# Patient Record
Sex: Female | Born: 1950 | Race: Black or African American | Hispanic: No | Smoking: Never smoker
Health system: Southern US, Community
[De-identification: ages and names within clinical notes are randomized; demographics above are authoritative.]

## PROBLEM LIST (undated history)

## (undated) DIAGNOSIS — E782 Mixed hyperlipidemia: Secondary | ICD-10-CM

## (undated) DIAGNOSIS — I10 Essential (primary) hypertension: Secondary | ICD-10-CM

## (undated) DIAGNOSIS — E119 Type 2 diabetes mellitus without complications: Secondary | ICD-10-CM

## (undated) DIAGNOSIS — M1990 Unspecified osteoarthritis, unspecified site: Secondary | ICD-10-CM

## (undated) DIAGNOSIS — M5430 Sciatica, unspecified side: Secondary | ICD-10-CM

## (undated) HISTORY — PX: OTHER SURGICAL HISTORY: SHX169

## (undated) HISTORY — DX: Type 2 diabetes mellitus without complications: E11.9

## (undated) HISTORY — PX: TUBAL LIGATION: SHX77

## (undated) HISTORY — DX: Unspecified osteoarthritis, unspecified site: M19.90

## (undated) HISTORY — DX: Mixed hyperlipidemia: E78.2

## (undated) HISTORY — PX: ABDOMINAL HYSTERECTOMY: SHX81

## (undated) HISTORY — DX: Sciatica, unspecified side: M54.30

## (undated) HISTORY — DX: Essential (primary) hypertension: I10

---

## 2011-03-05 ENCOUNTER — Ambulatory Visit: Payer: Self-pay | Admitting: Gastroenterology

## 2019-04-20 ENCOUNTER — Other Ambulatory Visit (HOSPITAL_COMMUNITY): Payer: Self-pay | Admitting: Family

## 2019-04-20 DIAGNOSIS — R928 Other abnormal and inconclusive findings on diagnostic imaging of breast: Secondary | ICD-10-CM

## 2019-04-28 ENCOUNTER — Other Ambulatory Visit (HOSPITAL_COMMUNITY): Payer: Self-pay | Admitting: Family

## 2019-04-28 DIAGNOSIS — R928 Other abnormal and inconclusive findings on diagnostic imaging of breast: Secondary | ICD-10-CM

## 2019-05-05 ENCOUNTER — Other Ambulatory Visit (HOSPITAL_COMMUNITY): Payer: Self-pay | Admitting: Family

## 2019-05-11 ENCOUNTER — Other Ambulatory Visit: Payer: Self-pay

## 2019-05-11 ENCOUNTER — Ambulatory Visit (HOSPITAL_COMMUNITY)
Admission: RE | Admit: 2019-05-11 | Discharge: 2019-05-11 | Disposition: A | Discharge status: Home / Self Care | Payer: Medicare HMO | Source: Ambulatory Visit | Attending: Family | Admitting: Family

## 2019-05-11 ENCOUNTER — Ambulatory Visit (HOSPITAL_COMMUNITY): Payer: Medicare HMO

## 2019-05-11 DIAGNOSIS — R928 Other abnormal and inconclusive findings on diagnostic imaging of breast: Secondary | ICD-10-CM | POA: Diagnosis present

## 2020-04-17 ENCOUNTER — Other Ambulatory Visit (HOSPITAL_COMMUNITY): Payer: Self-pay | Admitting: Family

## 2020-04-17 DIAGNOSIS — Z1231 Encounter for screening mammogram for malignant neoplasm of breast: Secondary | ICD-10-CM

## 2020-05-12 ENCOUNTER — Other Ambulatory Visit: Payer: Self-pay

## 2020-05-12 ENCOUNTER — Ambulatory Visit (HOSPITAL_COMMUNITY)
Admission: RE | Admit: 2020-05-12 | Discharge: 2020-05-12 | Disposition: A | Discharge status: Home / Self Care | Payer: Medicare HMO | Source: Ambulatory Visit | Attending: Family | Admitting: Family

## 2020-05-12 DIAGNOSIS — Z1231 Encounter for screening mammogram for malignant neoplasm of breast: Secondary | ICD-10-CM | POA: Insufficient documentation

## 2020-08-07 ENCOUNTER — Inpatient Hospital Stay (HOSPITAL_COMMUNITY): Payer: Medicare HMO

## 2020-08-07 ENCOUNTER — Inpatient Hospital Stay (HOSPITAL_COMMUNITY): Payer: Medicare HMO | Attending: Oncology | Admitting: Hematology

## 2020-08-07 ENCOUNTER — Other Ambulatory Visit: Payer: Self-pay

## 2020-08-07 ENCOUNTER — Encounter (HOSPITAL_COMMUNITY): Payer: Self-pay | Admitting: Hematology

## 2020-08-07 VITALS — BP 113/67 | HR 112 | Temp 97.6°F | Resp 18 | Ht 62.5 in | Wt 218.0 lb

## 2020-08-07 DIAGNOSIS — Z6836 Body mass index (BMI) 36.0-36.9, adult: Secondary | ICD-10-CM | POA: Diagnosis not present

## 2020-08-07 DIAGNOSIS — I10 Essential (primary) hypertension: Secondary | ICD-10-CM | POA: Insufficient documentation

## 2020-08-07 DIAGNOSIS — E785 Hyperlipidemia, unspecified: Secondary | ICD-10-CM | POA: Diagnosis not present

## 2020-08-07 DIAGNOSIS — E119 Type 2 diabetes mellitus without complications: Secondary | ICD-10-CM | POA: Insufficient documentation

## 2020-08-07 DIAGNOSIS — D72829 Elevated white blood cell count, unspecified: Secondary | ICD-10-CM

## 2020-08-07 DIAGNOSIS — Z79899 Other long term (current) drug therapy: Secondary | ICD-10-CM | POA: Diagnosis not present

## 2020-08-07 DIAGNOSIS — D72820 Lymphocytosis (symptomatic): Secondary | ICD-10-CM

## 2020-08-07 DIAGNOSIS — D75839 Thrombocytosis, unspecified: Secondary | ICD-10-CM

## 2020-08-07 DIAGNOSIS — Z806 Family history of leukemia: Secondary | ICD-10-CM | POA: Diagnosis not present

## 2020-08-07 DIAGNOSIS — E669 Obesity, unspecified: Secondary | ICD-10-CM | POA: Diagnosis not present

## 2020-08-07 DIAGNOSIS — Z596 Low income: Secondary | ICD-10-CM | POA: Diagnosis not present

## 2020-08-07 LAB — CBC WITH DIFFERENTIAL/PLATELET
Abs Immature Granulocytes: 0.04 10*3/uL (ref 0.00–0.07)
Basophils Absolute: 0 10*3/uL (ref 0.0–0.1)
Basophils Relative: 0 %
Eosinophils Absolute: 0.1 10*3/uL (ref 0.0–0.5)
Eosinophils Relative: 0 %
HCT: 42.9 % (ref 36.0–46.0)
Hemoglobin: 14 g/dL (ref 12.0–15.0)
Immature Granulocytes: 0 %
Lymphocytes Relative: 37 %
Lymphs Abs: 5 10*3/uL — ABNORMAL HIGH (ref 0.7–4.0)
MCH: 30 pg (ref 26.0–34.0)
MCHC: 32.6 g/dL (ref 30.0–36.0)
MCV: 92.1 fL (ref 80.0–100.0)
Monocytes Absolute: 1.1 10*3/uL — ABNORMAL HIGH (ref 0.1–1.0)
Monocytes Relative: 8 %
Neutro Abs: 7.3 10*3/uL (ref 1.7–7.7)
Neutrophils Relative %: 55 %
Platelets: 396 10*3/uL (ref 150–400)
RBC: 4.66 MIL/uL (ref 3.87–5.11)
RDW: 13.8 % (ref 11.5–15.5)
WBC: 13.6 10*3/uL — ABNORMAL HIGH (ref 4.0–10.5)
nRBC: 0 % (ref 0.0–0.2)

## 2020-08-07 LAB — COMPREHENSIVE METABOLIC PANEL
ALT: 15 U/L (ref 0–44)
AST: 17 U/L (ref 15–41)
Albumin: 4.2 g/dL (ref 3.5–5.0)
Alkaline Phosphatase: 97 U/L (ref 38–126)
Anion gap: 10 (ref 5–15)
BUN: 16 mg/dL (ref 8–23)
CO2: 25 mmol/L (ref 22–32)
Calcium: 8.9 mg/dL (ref 8.9–10.3)
Chloride: 101 mmol/L (ref 98–111)
Creatinine, Ser: 0.71 mg/dL (ref 0.44–1.00)
GFR, Estimated: >60 mL/min (ref >60)
Glucose, Bld: 95 mg/dL (ref 70–99)
Potassium: 3.3 mmol/L — ABNORMAL LOW (ref 3.5–5.1)
Sodium: 136 mmol/L (ref 135–145)
Total Bilirubin: 0.6 mg/dL (ref 0.3–1.2)
Total Protein: 8 g/dL (ref 6.5–8.1)

## 2020-08-07 LAB — LACTATE DEHYDROGENASE: LDH: 140 U/L (ref 98–192)

## 2020-08-07 LAB — SEDIMENTATION RATE: Sed Rate: 35 mm/hr — ABNORMAL HIGH (ref 0–22)

## 2020-08-07 LAB — C-REACTIVE PROTEIN: CRP: 0.9 mg/dL (ref <1.0)

## 2020-08-07 NOTE — Progress Notes (Signed)
CONSULT NOTE  Patient Care Team: [facility] as PCP - General  CHIEF COMPLAINTS/PURPOSE OF CONSULTATION: Leukocytosis and thrombocytosis  HISTORY OF PRESENTING ILLNESS:  Deborah Todd 69 y.o. female is here because of leukocytosis with mainly lymphocytes and monocytes and thrombocytosis.  She was referred by her primary care provider from [facility] family medicine.   On chart review her white count has been as elevated as 13.5 (lymphocytes 4928 and monocytes 1013) and platelets have been as high as 455,000 dated back from March 2020.  Deborah Todd is a 69 year old female who is relatively healthy.  She has past medical history significant for hypertension, type 2 diabetes, obesity and hyperlipidemia.  She denies any recent infections, acute or chronic inflammation, history of autoimmune disease, suspicious medications such as systemic steroid use, previous abdominal surgeries or surgical splenectomy.  She is not a current smoker.  She is obese her BMI is 36.9.  Denies family history of blood disorders including CML or other myeloma proliferative neoplasms.  Denies a history of anemia. She has been treated on several occasions for sciatic pain with steroid tapers but none recently.  She denies any B symptoms including anorexia, night sweats or change in appetite.  She denies any abdominal bloating or pain.  She denies any allergies.  She believes her mother passed away from leukemia at the age of 103.  MEDICAL HISTORY:  No past medical history on file.  SURGICAL HISTORY: Non-smoker  SOCIAL HISTORY: Social History   Socioeconomic History  . Marital status: Divorced    Spouse name: Not on file  . Number of children: Not on file  . Years of education: Not on file  . Highest education level: Not on file  Occupational History  . Not on file  Tobacco Use  . Smoking status: Never Smoker  . Smokeless tobacco: Never Used  Substance and Sexual Activity  .  Alcohol use: Never  . Drug use: Never  . Sexual activity: Not Currently  Other Topics Concern  . Not on file  Social History Narrative  . Not on file   Social Determinants of Health   Financial Resource Strain: Medium Risk  . Difficulty of Paying Living Expenses: Somewhat hard  Food Insecurity: No Food Insecurity  . Worried About Charity fundraiser in the Last Year: Never true  . Ran Out of Food in the Last Year: Never true  Transportation Needs: No Transportation Needs  . Lack of Transportation (Medical): No  . Lack of Transportation (Non-Medical): No  Physical Activity: Inactive  . Days of Exercise per Week: 0 days  . Minutes of Exercise per Session: 0 min  Stress: No Stress Concern Present  . Feeling of Stress : Not at all  Social Connections: Moderately Isolated  . Frequency of Communication with Friends and Family: More than three times a week  . Frequency of Social Gatherings with Friends and Family: Once a week  . Attends Religious Services: More than 4 times per year  . Active Member of Clubs or Organizations: No  . Attends Archivist Meetings: Never  . Marital Status: Divorced  Human resources officer Violence: Not At Risk  . Fear of Current or Ex-Partner: No  . Emotionally Abused: No  . Physically Abused: No  . Sexually Abused: No    FAMILY HISTORY: No family history on file.  ALLERGIES:  has No Known Allergies.  MEDICATIONS:  Current Outpatient Medications  Medication Sig Dispense Refill  . Ascorbic  Acid (VITAMIN C PO) Take by mouth.    Marland Kitchen atorvastatin (LIPITOR) 20 MG tablet Take 20 mg by mouth daily.    Marland Kitchen CALCIUM PO Take by mouth.    . Cholecalciferol (VITAMIN D3 PO) Take by mouth.    . Cyanocobalamin (VITAMIN B12 PO) Take by mouth.    . hydrochlorothiazide (HYDRODIURIL) 12.5 MG tablet Take 12.5 mg by mouth daily.    . metFORMIN (GLUCOPHAGE-XR) 500 MG 24 hr tablet Take 500 mg by mouth daily.     No current facility-administered medications for this  visit.    REVIEW OF SYSTEMS:   Constitutional: Denies fevers, chills or abnormal night sweats Eyes: Denies blurriness of vision, double vision or watery eyes Ears, nose, mouth, throat, and face: Denies mucositis or sore throat Respiratory: Denies cough, dyspnea or wheezes Cardiovascular: Denies palpitation, chest discomfort or lower extremity swelling Gastrointestinal:  Denies nausea, heartburn or change in bowel habits Skin: Denies abnormal skin rashes Lymphatics: Denies new lymphadenopathy or easy bruising Neurological:Denies numbness, tingling or new weaknesses Behavioral/Psych: Mood is stable, no new changes  All other systems were reviewed with the patient and are negative.  PHYSICAL EXAMINATION: ECOG PERFORMANCE STATUS: 1 - Symptomatic but completely ambulatory  Vitals:   08/07/20 1247  BP: 113/67  Pulse: (!) 112  Resp: 18  Temp: 97.6 F (36.4 C)  SpO2: 96%   Filed Weights   08/07/20 1247  Weight: 218 lb (98.9 kg)   Physical Exam Constitutional:      Appearance: Normal appearance.  HENT:     Head: Normocephalic and atraumatic.  Eyes:     Pupils: Pupils are equal, round, and reactive to light.  Cardiovascular:     Rate and Rhythm: Normal rate and regular rhythm.     Heart sounds: Normal heart sounds. No murmur heard.   Pulmonary:     Effort: Pulmonary effort is normal.     Breath sounds: Normal breath sounds. No wheezing.  Abdominal:     General: Bowel sounds are normal. There is no distension.     Palpations: Abdomen is soft.     Tenderness: There is no abdominal tenderness.  Musculoskeletal:        General: Normal range of motion.     Cervical back: Normal range of motion.  Skin:    General: Skin is warm and dry.     Findings: No rash.  Neurological:     Mental Status: She is alert and oriented to person, place, and time.  Psychiatric:        Judgment: Judgment normal.      LABORATORY DATA:  I have reviewed the data as listed Recent Results  (from the past 2160 hour(s))  CBC with Differential/Platelet     Status: Abnormal   Collection Time: 08/07/20  2:45 PM  Result Value Ref Range   WBC 13.6 (H) 4.0 - 10.5 K/uL   RBC 4.66 3.87 - 5.11 MIL/uL   Hemoglobin 14.0 12.0 - 15.0 g/dL   HCT 42.9 36 - 46 %   MCV 92.1 80.0 - 100.0 fL   MCH 30.0 26.0 - 34.0 pg   MCHC 32.6 30.0 - 36.0 g/dL   RDW 13.8 11.5 - 15.5 %   Platelets 396 150 - 400 K/uL   nRBC 0.0 0.0 - 0.2 %   Neutrophils Relative % 55 %   Neutro Abs 7.3 1.7 - 7.7 K/uL   Lymphocytes Relative 37 %   Lymphs Abs 5.0 (H) 0.7 - 4.0 K/uL  Monocytes Relative 8 %   Monocytes Absolute 1.1 (H) 0.1 - 1.0 K/uL   Eosinophils Relative 0 %   Eosinophils Absolute 0.1 0.0 - 0.5 K/uL   Basophils Relative 0 %   Basophils Absolute 0.0 0.0 - 0.1 K/uL   Immature Granulocytes 0 %   Abs Immature Granulocytes 0.04 0.00 - 0.07 K/uL    Comment: Performed at [facility], [address] [place], [place] [address]  Comprehensive metabolic panel     Status: Abnormal   Collection Time: 08/07/20  2:45 PM  Result Value Ref Range   Sodium 136 135 - 145 mmol/L   Potassium 3.3 (L) 3.5 - 5.1 mmol/L   Chloride 101 98 - 111 mmol/L   CO2 25 22 - 32 mmol/L   Glucose, Bld 95 70 - 99 mg/dL    Comment: Glucose reference range applies only to samples taken after fasting for at least 8 hours.   BUN 16 8 - 23 mg/dL   Creatinine, Ser 0.71 0.44 - 1.00 mg/dL   Calcium 8.9 8.9 - 10.3 mg/dL   Total Protein 8.0 6.5 - 8.1 g/dL   Albumin 4.2 3.5 - 5.0 g/dL   AST 17 15 - 41 U/L   ALT 15 0 - 44 U/L   Alkaline Phosphatase 97 38 - 126 U/L   Total Bilirubin 0.6 0.3 - 1.2 mg/dL   GFR, Estimated >60 >60 mL/min    Comment: (NOTE) Calculated using the CKD-EPI Creatinine Equation (2021)    Anion gap 10 5 - 15    Comment: Performed at [facility], [address] [place], [place] [address]  Sedimentation rate     Status: Abnormal   Collection Time: 08/07/20  2:45 PM  Result Value Ref Range   Sed Rate 35 (H) 0 - 22  mm/hr    Comment: Performed at [facility], [address] [place], [place] [address]  Lactate dehydrogenase     Status: None   Collection Time: 08/07/20  2:45 PM  Result Value Ref Range   LDH 140 98 - 192 U/L    Comment: Performed at [facility], [address] [place], [place] [address]  C-reactive protein     Status: None   Collection Time: 08/07/20  2:46 PM  Result Value Ref Range   CRP 0.9 <1.0 mg/dL    Comment: Performed at [facility], [address] [place], [place] [address]    RADIOGRAPHIC STUDIES: I have personally reviewed the radiological images as listed and agreed with the findings in the report. No results found.  ASSESSMENT & PLAN:  Leukocytosis: -Initially discovered in March 2020 ranging from normal to 13.5 -Mainly lymphocytes 4928 and monocytes as high as 1013 -Denies recent infection or systemic steroid use -Denies history of GI surgeries or asplenia. -Non-smoker. -No autoimmune disease -No history of anemia or polycythemia -No suspect medications -No history of hepatitis or HIV -No recent trauma -No lymphadenopathy  Thrombocytosis: -Initially discovered in March 2020 ranging from normal to 452,000 -Denies anemia or blood loss. -Denies recent infection. -Denies history of GI surgeries or asplenia -Denies flushing or erythromelalgia -No B symptoms or unexplained vasomotor symptoms. -No history of blood clots. -Non-smoker.  Social history: -Mother died of leukemia at age of 59.  Hypertension: -On hydrochlorothiazide  Type 2 diabetes: -A1c 6.2 -Stable on Metformin  Hyperlipidemia: -Stable on atorvastatin  Plan: Leukocytosis: -Unclear etiology but will check lab work today.  -CMP  -CBC  -ESR  -C-reactive protein  -Flow cytometry  -Blood   smear Thrombocytosis: -Likely reactive but will check lab work today.  -JAK2  -CBC  -CMP  -Flow cytometry  -Blood smear  -LDH, CRP, sed rate  Disposition: RTC in 2 to 3 weeks for results  from lab work  Greater than 50% was spent in counseling and coordination of care with this patient including but not limited to discussion of the relevant topics above (See A&P) including, but not limited to diagnosis and management of acute and chronic medical conditions.  Addendum: I have independently interviewed this patient and agree with HPI written by my nurse practitioner Jennifer Burns, NP.  Patient evaluated for leukocytosis and thrombocytosis, predominantly lymphocytes and monocytes.  She is a non-smoker.  No history of autoimmune problems.  No prior splenectomy.  We will repeat CBC today and send for flow cytometry.  Because of elevated platelet count, will also check JAK2 mutation.  RTC 2 weeks to discuss results.    Sreedhar Katragadda, MD 08/07/20 5:43 PM    

## 2020-08-10 LAB — CD19 AND CD20, FLOW CYTOMETRY

## 2020-08-21 LAB — CALR + JAK2 E12-15 + MPL (REFLEXED)

## 2020-08-21 LAB — JAK2 V617F, W REFLEX TO CALR/E12/MPL

## 2020-08-30 ENCOUNTER — Other Ambulatory Visit: Payer: Self-pay

## 2020-08-30 ENCOUNTER — Inpatient Hospital Stay (HOSPITAL_COMMUNITY): Payer: Medicare HMO | Attending: Oncology | Admitting: Hematology

## 2020-08-30 VITALS — BP 121/82 | HR 91 | Resp 16 | Wt 215.4 lb

## 2020-08-30 DIAGNOSIS — R5383 Other fatigue: Secondary | ICD-10-CM | POA: Insufficient documentation

## 2020-08-30 DIAGNOSIS — G479 Sleep disorder, unspecified: Secondary | ICD-10-CM | POA: Diagnosis not present

## 2020-08-30 DIAGNOSIS — E119 Type 2 diabetes mellitus without complications: Secondary | ICD-10-CM | POA: Diagnosis not present

## 2020-08-30 DIAGNOSIS — D72820 Lymphocytosis (symptomatic): Secondary | ICD-10-CM | POA: Diagnosis present

## 2020-08-30 DIAGNOSIS — E785 Hyperlipidemia, unspecified: Secondary | ICD-10-CM | POA: Insufficient documentation

## 2020-08-30 DIAGNOSIS — R0602 Shortness of breath: Secondary | ICD-10-CM | POA: Insufficient documentation

## 2020-08-30 DIAGNOSIS — Z79899 Other long term (current) drug therapy: Secondary | ICD-10-CM | POA: Insufficient documentation

## 2020-08-30 DIAGNOSIS — E669 Obesity, unspecified: Secondary | ICD-10-CM | POA: Diagnosis not present

## 2020-08-30 DIAGNOSIS — R519 Headache, unspecified: Secondary | ICD-10-CM | POA: Insufficient documentation

## 2020-08-30 DIAGNOSIS — I10 Essential (primary) hypertension: Secondary | ICD-10-CM | POA: Diagnosis not present

## 2020-08-30 DIAGNOSIS — D75839 Thrombocytosis, unspecified: Secondary | ICD-10-CM | POA: Diagnosis not present

## 2020-08-30 NOTE — Progress Notes (Signed)
[address] [place], [place] [address]   CLINIC:  Medical Oncology/Hematology  PCP:  [facility] / [place] [address]  [phone]  REASON FOR VISIT:  Follow-up for lymphocytosis and thrombocytosis  PRIOR THERAPY: None  CURRENT THERAPY: Under work-up  INTERVAL HISTORY:  Deborah Todd, a 69 y.o. female, returns for routine follow-up for her lymphocytosis and thrombocytosis. Deborah Todd was last seen on 08/07/2020.  Today she is accompanied by her daughter and she reports feeling well. She was treated for sciatica with prednisone in June. Her energy has improved in the past 1 month and she denies having any recent infections, F/C or night sweats.  She has received her COVID vaccines.  REVIEW OF SYSTEMS:  Review of Systems  Constitutional: Positive for fatigue (75%). Negative for appetite change, chills, diaphoresis and fever.  Respiratory: Positive for shortness of breath (w/ exertion).   Neurological: Positive for headaches (occasional).  Psychiatric/Behavioral: Positive for sleep disturbance.  All other systems reviewed and are negative.   PAST MEDICAL/SURGICAL HISTORY:  No past medical history on file. ** The histories are not reviewed yet. Please review them in the "History" navigator section and refresh this [place].  SOCIAL HISTORY:  Social History   Socioeconomic History  . Marital status: Divorced    Spouse name: Not on file  . Number of children: Not on file  . Years of education: Not on file  . Highest education level: Not on file  Occupational History  . Not on file  Tobacco Use  . Smoking status: Never Smoker  . Smokeless tobacco: Never Used  Substance and Sexual Activity  . Alcohol use: Never  . Drug use: Never  . Sexual activity: Not Currently  Other Topics Concern  . Not on file  Social History Narrative  . Not on file   Social Determinants of Health   Financial  Resource Strain: Medium Risk  . Difficulty of Paying Living Expenses: Somewhat hard  Food Insecurity: No Food Insecurity  . Worried About Charity fundraiser in the Last Year: Never true  . Ran Out of Food in the Last Year: Never true  Transportation Needs: No Transportation Needs  . Lack of Transportation (Medical): No  . Lack of Transportation (Non-Medical): No  Physical Activity: Inactive  . Days of Exercise per Week: 0 days  . Minutes of Exercise per Session: 0 min  Stress: No Stress Concern Present  . Feeling of Stress : Not at all  Social Connections: Moderately Isolated  . Frequency of Communication with Friends and Family: More than three times a week  . Frequency of Social Gatherings with Friends and Family: Once a week  . Attends Religious Services: More than 4 times per year  . Active Member of Clubs or Organizations: No  . Attends Archivist Meetings: Never  . Marital Status: Divorced  Human resources officer Violence: Not At Risk  . Fear of Current or Ex-Partner: No  . Emotionally Abused: No  . Physically Abused: No  . Sexually Abused: No    FAMILY HISTORY:  No family history on file.  CURRENT MEDICATIONS:  Current Outpatient Medications  Medication Sig Dispense Refill  . Ascorbic Acid (VITAMIN C PO) Take by mouth.    Marland Kitchen atorvastatin (LIPITOR) 20 MG tablet Take 20 mg by mouth daily.    Marland Kitchen CALCIUM PO Take by mouth.    . Cholecalciferol (VITAMIN D3 PO) Take by mouth.    Marland Kitchen  Cyanocobalamin (VITAMIN B12 PO) Take by mouth.    . hydrochlorothiazide (HYDRODIURIL) 12.5 MG tablet Take 12.5 mg by mouth daily.    . metFORMIN (GLUCOPHAGE-XR) 500 MG 24 hr tablet Take 500 mg by mouth daily.     No current facility-administered medications for this visit.    ALLERGIES:  No Known Allergies  PHYSICAL EXAM:  Performance status (ECOG): 1 - Symptomatic but completely ambulatory  Vitals:   08/30/20 1549  BP: 121/82  Pulse: 91  Resp: 16  SpO2: 94%   Wt Readings from  Last 3 Encounters:  08/30/20 215 lb 6.4 oz (97.7 kg)  08/07/20 218 lb (98.9 kg)   Physical Exam Vitals reviewed.  Constitutional:      Appearance: Normal appearance. She is obese.  Neurological:     General: No focal deficit present.     Mental Status: She is alert and oriented to person, place, and time.  Psychiatric:        Mood and Affect: Mood normal.        Behavior: Behavior normal.     LABORATORY DATA:  I have reviewed the labs as listed.  CBC Latest Ref Rng & Units 08/07/2020  WBC 4.0 - 10.5 K/uL 13.6(H)  Hemoglobin 12.0 - 15.0 g/dL 14.0  Hematocrit 36 - 46 % 42.9  Platelets 150 - 400 K/uL 396   CMP Latest Ref Rng & Units 08/07/2020  Glucose 70 - 99 mg/dL 95  BUN 8 - 23 mg/dL 16  Creatinine 0.44 - 1.00 mg/dL 0.71  Sodium 135 - 145 mmol/L 136  Potassium 3.5 - 5.1 mmol/L 3.3(L)  Chloride 98 - 111 mmol/L 101  CO2 22 - 32 mmol/L 25  Calcium 8.9 - 10.3 mg/dL 8.9  Total Protein 6.5 - 8.1 g/dL 8.0  Total Bilirubin 0.3 - 1.2 mg/dL 0.6  Alkaline Phos 38 - 126 U/L 97  AST 15 - 41 U/L 17  ALT 0 - 44 U/L 15      Component Value Date/Time   RBC 4.66 08/07/2020 1445   MCV 92.1 08/07/2020 1445   MCH 30.0 08/07/2020 1445   MCHC 32.6 08/07/2020 1445   RDW 13.8 08/07/2020 1445   LYMPHSABS 5.0 (H) 08/07/2020 1445   MONOABS 1.1 (H) 08/07/2020 1445   EOSABS 0.1 08/07/2020 1445   BASOSABS 0.0 08/07/2020 1445   Lab Results  Component Value Date   LDH 140 08/07/2020    DIAGNOSTIC IMAGING:  I have independently reviewed the scans and discussed with the patient. No results found.   ASSESSMENT:  1.  Lymphocytosis: -She had elevated leukocytosis since March 2020 with white count ranging between 13.5 and 11.5. -Differential shows predominantly lymphocytosis and monocytosis. -She does not have any history of connective tissue disorders. -No recurrent infections noted.  No palpable adenopathy or splenomegaly. -JAK2 V617F testing on 08/07/2020 was negative in addition to  reflex mutation testing. -She is non-smoker.  No prior history of splenectomy.  2.  Thrombocytosis: -Platelet count was as high as 452,000 since March 2020. -No prior history of thrombosis.  No erythromelalgia's.  3.  Social history: -Mother died of leukemia at age of 74.  85.  Hypertension: -On hydrochlorothiazide  5.  Type 2 diabetes: -A1c 6.2 -Stable on Metformin  6.  Hyperlipidemia: -Stable on atorvastatin   PLAN:  1.  Lymphocytic leukocytosis: -We reviewed results of CBC from 08/07/2020 which showed white count 13.6 with absolute lymphocyte count of 5000 and absolute monocyte count of 1100. -We reviewed results of JAK2 testing  which was negative. -ESR was elevated at 35 with normal CRP and LDH. -As no alarming symptoms, I have recommended follow-up in 6 months with repeat CBC.  We will also check flow cytometry for leukemia and lymphoma panel.  2. Thrombocytosis: -Latest CBC shows resolved thrombocytosis with platelet count 396.  JAK2 testing is negative.  No further testing necessary.   Orders placed this encounter:  No orders of the defined types were placed in this encounter.    Derek Jack, MD [place] [phone]   I, Milinda Antis, am acting as a scribe for Dr. Sanda Linger.  I, Derek Jack MD, have reviewed the above documentation for accuracy and completeness, and I agree with the above.

## 2020-08-30 NOTE — Patient Instructions (Signed)
[facility] at [facility] Discharge Instructions  You were seen today by Dr. Ellin Saba. He went over your recent results; your blood work was negative for any leukemia or myeloproliferative mutations. Dr. Ellin Saba will see you back in 6 months for labs and follow up.   Thank you for choosing  Cancer Center at [facility] to provide your oncology and hematology care.  To afford each patient quality time with our provider, please arrive at least 15 minutes before your scheduled appointment time.   If you have a lab appointment with the Cancer Center please come in thru the Main Entrance and check in at the main information desk  You need to re-schedule your appointment should you arrive 10 or more minutes late.  We strive to give you quality time with our providers, and arriving late affects you and other patients whose appointments are after yours.  Also, if you no show three or more times for appointments you may be dismissed from the clinic at the providers discretion.     Again, thank you for choosing [facility].  Our hope is that these requests will decrease the amount of time that you wait before being seen by our physicians.       _____________________________________________________________  Should you have questions after your visit to [facility], please contact our office at [phone] between the hours of 8:00 a.m. and 4:30 p.m.  Voicemails left after 4:00 p.m. will not be returned until the following business day.  For prescription refill requests, have your pharmacy contact our office and allow 72 hours.    Cancer Center Support Programs:   > Cancer Support Group  2nd Tuesday of the month 1pm-2pm, Journey Room

## 2021-02-07 ENCOUNTER — Other Ambulatory Visit (HOSPITAL_COMMUNITY): Payer: Self-pay | Admitting: Family Medicine

## 2021-02-07 DIAGNOSIS — Z1231 Encounter for screening mammogram for malignant neoplasm of breast: Secondary | ICD-10-CM

## 2021-02-21 ENCOUNTER — Ambulatory Visit (INDEPENDENT_AMBULATORY_CARE_PROVIDER_SITE_OTHER): Payer: Medicare HMO

## 2021-02-21 ENCOUNTER — Telehealth: Payer: Self-pay

## 2021-02-21 ENCOUNTER — Encounter: Payer: Self-pay | Admitting: Cardiology

## 2021-02-21 ENCOUNTER — Other Ambulatory Visit: Payer: Self-pay | Admitting: Cardiology

## 2021-02-21 ENCOUNTER — Ambulatory Visit: Payer: Medicare HMO | Admitting: Cardiology

## 2021-02-21 ENCOUNTER — Other Ambulatory Visit: Payer: Self-pay

## 2021-02-21 VITALS — BP 120/86 | HR 100 | Ht 64.0 in | Wt 211.8 lb

## 2021-02-21 DIAGNOSIS — R002 Palpitations: Secondary | ICD-10-CM

## 2021-02-21 DIAGNOSIS — M5430 Sciatica, unspecified side: Secondary | ICD-10-CM

## 2021-02-21 DIAGNOSIS — R42 Dizziness and giddiness: Secondary | ICD-10-CM | POA: Diagnosis not present

## 2021-02-21 DIAGNOSIS — E119 Type 2 diabetes mellitus without complications: Secondary | ICD-10-CM | POA: Insufficient documentation

## 2021-02-21 DIAGNOSIS — I10 Essential (primary) hypertension: Secondary | ICD-10-CM

## 2021-02-21 DIAGNOSIS — R011 Cardiac murmur, unspecified: Secondary | ICD-10-CM | POA: Diagnosis not present

## 2021-02-21 DIAGNOSIS — E785 Hyperlipidemia, unspecified: Secondary | ICD-10-CM | POA: Insufficient documentation

## 2021-02-21 NOTE — Progress Notes (Signed)
Cardiology Office Note  Date: 02/21/2021   ID: Deborah Todd, DOB November 24, 1950, MRN [id number]  PCP:  [facility]  Cardiologist:  Nona Dell, MD Electrophysiologist:  None   Chief Complaint  Patient presents with  . Palpitations    History of Present Illness: Deborah Todd is a 70 y.o. female referred for cardiology consultation by Ms. Yetta Barre NP with the [facility] for the evaluation of palpitations.  We discussed her symptoms today. She tells me that she has felt a sense of rapid heartbeat with activity over the last few months.  Also generally fatigued and lightheaded when standing, particularly in the mornings.  She states that she was treated for sinus infection and had some improvement in her lightheadedness, but generally this has been a persistent problem.  She has had no syncope or chest pain.  She admits that she has not been as active since she retired.  She reports compliance with her medications.  Blood pressure tends to be low normal range on HCTZ and potassium supplement.  She does have a blood pressure cuff at home.  I personally reviewed her ECG today which shows sinus rhythm.   Past Medical History:  Diagnosis Date  . Arthritis   . Essential hypertension   . Mixed hyperlipidemia   . Sciatica   . Type 2 diabetes mellitus (HCC)     Past Surgical History:  Procedure Laterality Date  . ABDOMINAL HYSTERECTOMY    . Oophorectomy    . TUBAL LIGATION      Current Outpatient Medications  Medication Sig Dispense Refill  . Ascorbic Acid (VITAMIN C PO) Take by mouth.    Marland Kitchen atorvastatin (LIPITOR) 20 MG tablet Take 20 mg by mouth daily.    Marland Kitchen CALCIUM PO Take by mouth.    . Cholecalciferol (VITAMIN D3 PO) Take by mouth.    . Cyanocobalamin (VITAMIN B12 PO) Take by mouth.    . fluticasone (FLONASE) 50 MCG/ACT nasal spray Place 1 spray into both nostrils daily.    . hydrochlorothiazide (HYDRODIURIL) 12.5 MG tablet  Take 12.5 mg by mouth daily.    . metFORMIN (GLUCOPHAGE-XR) 500 MG 24 hr tablet Take 500 mg by mouth daily.    . Potassium Chloride (KLOR-CON 10 PO) Take 10 mEq by mouth daily.     No current facility-administered medications for this visit.   Allergies:  Patient has no known allergies.   Social History: The patient  reports that she has never smoked. She has never used smokeless tobacco. She reports that she does not drink alcohol and does not use drugs.   Family History: The patient's family history includes Cancer in her mother; Diabetes Mellitus II in her mother.   ROS: No orthopnea or PND.  Physical Exam: VS:  BP 120/86 (BP Location: Right Arm)   Pulse 100   Ht 5\' 4"  (1.626 m)   Wt 211 lb 12.8 oz (96.1 kg)   SpO2 95%   BMI 36.36 kg/m , BMI Body mass index is 36.36 kg/m.  Wt Readings from Last 3 Encounters:  02/21/21 211 lb 12.8 oz (96.1 kg)  08/30/20 215 lb 6.4 oz (97.7 kg)  08/07/20 218 lb (98.9 kg)    General: Patient appears comfortable at rest. HEENT: Conjunctiva and lids normal, wearing a mask. Neck: Supple, no elevated JVP or carotid bruits, no thyromegaly. Lungs: Clear to auscultation, nonlabored breathing at rest. Cardiac: Regular rate and rhythm, no S3, 2/6 systolic murmur,  no pericardial rub. Abdomen: Soft, nontender, bowel sounds present. Extremities: No pitting edema, distal pulses 2+. Skin: Warm and dry. Musculoskeletal: No kyphosis. Neuropsychiatric: Alert and oriented x3, affect grossly appropriate.  ECG:  An ECG dated 01/16/2021 was personally reviewed today and demonstrated:  Normal sinus rhythm with possible left atrial enlargement and decreased R wave progression.  Recent Labwork: 08/07/2020: ALT 15; AST 17; BUN 16; Creatinine, Ser 0.71; Hemoglobin 14.0; Platelets 396; Potassium 3.3; Sodium 136  April 2022: Cholesterol 206, triglycerides 146, HDL 40, LDL 138, BUN 14, creatinine 0.67, potassium 4.0, AST 14, ALT 11  Other Studies Reviewed Today:  No  prior cardiac testing for review today.  Assessment and Plan:  1.  Intermittent palpitations as discussed above.  She notices this mainly with activity, no sudden onset or offset at rest.  No associated chest pain or syncope.  Baseline ECG is nonspecific.  We will obtain a 7-day Zio patch for further investigation of cardiac rhythm.  2.  Cardiac murmur in aortic position.  Obtain echocardiogram for cardiac structural evaluation.  3.  Recurring orthostatic lightheadedness without syncope.  Blood pressure low normal range in general.  I asked her to hold HCTZ and potassium supplement for a week or two and see if she feels any better, would track blood pressure at home during this time.  Medication Adjustments/Labs and Tests Ordered: Current medicines are reviewed at length with the patient today.  Concerns regarding medicines are outlined above.   Tests Ordered: Orders Placed This Encounter  Procedures  . EKG 12-Lead  . ECHOCARDIOGRAM COMPLETE    Medication Changes: No orders of the defined types were placed in this encounter.   Disposition:  Follow up test results.  Signed, Jonelle Sidle, MD, [facility] 02/21/2021 11:19 AM    [facility] at [facility] [address], [place], [place] [address] Phone: [phone]; Fax: [phone]

## 2021-02-21 NOTE — Patient Instructions (Signed)
Medication Instructions:  Your physician recommends that you continue on your current medications as directed. Please refer to the Current Medication list given to you today.  *If you need a refill on your cardiac medications before your next appointment, please call your pharmacy*   Lab Work: None If you have labs (blood work) drawn today and your tests are completely normal, you will receive your results only by: Marland Kitchen MyChart Message (if you have MyChart) OR . A paper copy in the mail If you have any lab test that is abnormal or we need to change your treatment, we will call you to review the results.   Testing/Procedures: Your physician has requested that you have an echocardiogram. Echocardiography is a painless test that uses sound waves to create images of your heart. It provides your doctor with information about the size and shape of your heart and how well your heart's chambers and valves are working. This procedure takes approximately one hour. There are no restrictions for this procedure.     Follow-Up: At [facility], you and your health needs are our priority.  As part of our continuing mission to provide you with exceptional heart care, we have created designated Provider Care Teams.  These Care Teams include your primary Cardiologist (physician) and Advanced Practice Providers (APPs -  Physician Assistants and Nurse Practitioners) who all work together to provide you with the care you need, when you need it.  We recommend signing up for the patient portal called "MyChart".  Sign up information is provided on this After Visit Summary.  MyChart is used to connect with patients for Virtual Visits (Telemedicine).  Patients are able to view lab/test results, encounter notes, upcoming appointments, etc.  Non-urgent messages can be sent to your provider as well.   To learn more about what you can do with MyChart, go to [email or web address].    Your next appointment:   Follow up-  Pending Zio/Echo Results   Other Instructions Your provider has requested that your wear a 7 Day Zio Patch for Palpitations.   ZIO AT  . Call [employer] company with any questions during wear 24/7: [phone] . Zio patch should be worn for 14 days unless otherwise instructed . The [facility]) will call you 24-48 hrs. after [employer] is applied, be sure to answer the phone from a 224 area code.  They may call during wear with important information regarding your heart, so answer any calls from [employer] . Do not shower for 24 hours after Zio is applied (sponge bath is fine, just keep the patch dry) . After that, when showering avoid direct shower stream of water onto Zio patch, pat dry around the patch . Avoid excessive sweating . Push the button when you are feeling any cardiac symptoms and record them in the logbook or on the Zio app . Refer to the removal date listed on the front of the symptom logbook and remove Zio patch according to the instructions in the back of the logbook . Place Zio patch inside transmitter (sticky side facing up, button facing down) and put both the gateway and symptom logbook in the prepaid mailing envelope included in the back of the transmitter.  Place inside mailbox or any USPS mailbox

## 2021-02-21 NOTE — Telephone Encounter (Signed)
Error

## 2021-03-01 DIAGNOSIS — R002 Palpitations: Secondary | ICD-10-CM | POA: Diagnosis not present

## 2021-03-09 ENCOUNTER — Telehealth: Payer: Self-pay

## 2021-03-09 NOTE — Telephone Encounter (Signed)
-----   Message from Jonelle Sidle, MD sent at 03/09/2021 10:13 AM EDT ----- Results reviewed.  Please let her know that her cardiac monitor was reassuring overall.  No unusual variation in heart rate, just rare atrial and ventricular ectopy but no arrhythmias.  Follow-up for echocardiogram as already scheduled.

## 2021-03-09 NOTE — Telephone Encounter (Signed)
Pt was notified and verbalized agreement.

## 2021-03-12 ENCOUNTER — Other Ambulatory Visit: Payer: Self-pay

## 2021-03-12 ENCOUNTER — Ambulatory Visit (HOSPITAL_COMMUNITY)
Admission: RE | Admit: 2021-03-12 | Discharge: 2021-03-12 | Disposition: A | Discharge status: Home / Self Care | Payer: Medicare HMO | Source: Ambulatory Visit | Attending: Cardiology | Admitting: Cardiology

## 2021-03-12 DIAGNOSIS — I517 Cardiomegaly: Secondary | ICD-10-CM | POA: Diagnosis not present

## 2021-03-12 DIAGNOSIS — R011 Cardiac murmur, unspecified: Secondary | ICD-10-CM

## 2021-03-12 DIAGNOSIS — E785 Hyperlipidemia, unspecified: Secondary | ICD-10-CM | POA: Insufficient documentation

## 2021-03-12 DIAGNOSIS — E119 Type 2 diabetes mellitus without complications: Secondary | ICD-10-CM | POA: Insufficient documentation

## 2021-03-12 DIAGNOSIS — R002 Palpitations: Secondary | ICD-10-CM | POA: Insufficient documentation

## 2021-03-12 DIAGNOSIS — I10 Essential (primary) hypertension: Secondary | ICD-10-CM | POA: Insufficient documentation

## 2021-03-12 LAB — ECHOCARDIOGRAM COMPLETE
Area-P 1/2: 2.29 cm2
S' Lateral: 2.4 cm

## 2021-03-12 NOTE — Progress Notes (Signed)
*  PRELIMINARY RESULTS* Echocardiogram 2D Echocardiogram has been performed.  Stacey Drain 03/12/2021, 2:49 PM

## 2021-03-14 ENCOUNTER — Inpatient Hospital Stay (HOSPITAL_COMMUNITY): Payer: Medicare HMO

## 2021-03-21 ENCOUNTER — Ambulatory Visit (HOSPITAL_COMMUNITY): Payer: Medicare HMO | Admitting: Hematology

## 2021-03-26 ENCOUNTER — Ambulatory Visit (HOSPITAL_COMMUNITY): Payer: Medicare HMO | Admitting: Hematology

## 2021-04-25 ENCOUNTER — Inpatient Hospital Stay (HOSPITAL_COMMUNITY): Payer: Medicare HMO | Attending: Hematology

## 2021-04-25 ENCOUNTER — Other Ambulatory Visit: Payer: Self-pay

## 2021-04-25 DIAGNOSIS — D72820 Lymphocytosis (symptomatic): Secondary | ICD-10-CM | POA: Insufficient documentation

## 2021-04-25 DIAGNOSIS — D75839 Thrombocytosis, unspecified: Secondary | ICD-10-CM | POA: Diagnosis present

## 2021-04-25 LAB — CBC WITH DIFFERENTIAL/PLATELET
Abs Immature Granulocytes: 0.05 10*3/uL (ref 0.00–0.07)
Basophils Absolute: 0.1 10*3/uL (ref 0.0–0.1)
Basophils Relative: 1 %
Eosinophils Absolute: 0.1 10*3/uL (ref 0.0–0.5)
Eosinophils Relative: 1 %
HCT: 42.2 % (ref 36.0–46.0)
Hemoglobin: 14.2 g/dL (ref 12.0–15.0)
Immature Granulocytes: 0 %
Lymphocytes Relative: 33 %
Lymphs Abs: 4.3 10*3/uL — ABNORMAL HIGH (ref 0.7–4.0)
MCH: 30.7 pg (ref 26.0–34.0)
MCHC: 33.6 g/dL (ref 30.0–36.0)
MCV: 91.1 fL (ref 80.0–100.0)
Monocytes Absolute: 1.1 10*3/uL — ABNORMAL HIGH (ref 0.1–1.0)
Monocytes Relative: 9 %
Neutro Abs: 7.4 10*3/uL (ref 1.7–7.7)
Neutrophils Relative %: 56 %
Platelets: 391 10*3/uL (ref 150–400)
RBC: 4.63 MIL/uL (ref 3.87–5.11)
RDW: 13.5 % (ref 11.5–15.5)
WBC: 13 10*3/uL — ABNORMAL HIGH (ref 4.0–10.5)
nRBC: 0 % (ref 0.0–0.2)

## 2021-04-25 LAB — LACTATE DEHYDROGENASE: LDH: 131 U/L (ref 98–192)

## 2021-05-01 NOTE — Progress Notes (Signed)
[facility] [address], [place] [address]   CLINIC:  Medical Oncology/Hematology  PCP:  [facility] [address] [place] [address] [phone]   REASON FOR VISIT:  Follow-up for lymphocytosis and thrombocytosis  PRIOR THERAPY: None  CURRENT THERAPY: Observation  INTERVAL HISTORY:  Deborah Todd 70 y.o. female returns for routine follow-up of lymphocytosis and thrombocytosis.  She was last seen by Dr. Ellin Saba on 08/30/2020.  At today's visit, she reports feeling fairly well.  No recent hospitalizations, surgeries, or changes in baseline health status.  She denies any recent infections or steroids.  She is a lifelong non-smoker.  No B symptoms such as fever, chills, night sweats, unintentional weight loss.  She denies any history of thrombosis.  No vasomotor symptoms or aquagenic pruritus.  She has 25% energy and 50% appetite. She endorses that she is maintaining a stable weight.   REVIEW OF SYSTEMS:  Review of Systems  Constitutional:  Positive for fatigue. Negative for appetite change, chills, diaphoresis, fever and unexpected weight change.  HENT:   Negative for lump/mass and nosebleeds.   Eyes:  Negative for eye problems.  Respiratory:  Negative for cough, hemoptysis and shortness of breath.   Cardiovascular:  Negative for chest pain, leg swelling and palpitations.  Gastrointestinal:  Negative for abdominal pain, blood in stool, constipation, diarrhea, nausea and vomiting.  Genitourinary:  Negative for hematuria.   Skin: Negative.   Neurological:  Positive for headaches and numbness (Peripheral neuropathy of hands and feet). Negative for dizziness and light-headedness.  Hematological:  Does not bruise/bleed easily.  Psychiatric/Behavioral:  The patient is nervous/anxious.      PAST MEDICAL/SURGICAL HISTORY:  Past Medical History:  Diagnosis Date   Arthritis    Essential hypertension    Mixed hyperlipidemia     Sciatica    Type 2 diabetes mellitus (HCC)    Past Surgical History:  Procedure Laterality Date   ABDOMINAL HYSTERECTOMY     Oophorectomy     TUBAL LIGATION       SOCIAL HISTORY:  Social History   Socioeconomic History   Marital status: Divorced    Spouse name: Not on file   Number of children: Not on file   Years of education: Not on file   Highest education level: Not on file  Occupational History   Not on file  Tobacco Use   Smoking status: Never   Smokeless tobacco: Never  Vaping Use   Vaping Use: Never used  Substance and Sexual Activity   Alcohol use: Never   Drug use: Never   Sexual activity: Not on file  Other Topics Concern   Not on file  Social History Narrative   Not on file   Social Determinants of Health   Financial Resource Strain: Medium Risk   Difficulty of Paying Living Expenses: Somewhat hard  Food Insecurity: No Food Insecurity   Worried About Running Out of Food in the Last Year: Never true   Ran Out of Food in the Last Year: Never true  Transportation Needs: No Transportation Needs   Lack of Transportation (Medical): No   Lack of Transportation (Non-Medical): No  Physical Activity: Inactive   Days of Exercise per Week: 0 days   Minutes of Exercise per Session: 0 min  Stress: No Stress Concern Present   Feeling of Stress : Not at all  Social Connections: Moderately Isolated   Frequency of Communication with Friends and Family: More than three times  a week   Frequency of Social Gatherings with Friends and Family: Once a week   Attends Religious Services: More than 4 times per year   Active Member of [employer] or Organizations: No   Attends Engineer, structural: Never   Marital Status: Divorced  Catering manager Violence: Not At Risk   Fear of Current or Ex-Partner: No   Emotionally Abused: No   Physically Abused: No   Sexually Abused: No    FAMILY HISTORY:  Family History  Problem Relation Age of Onset   Cancer Mother     Diabetes Mellitus II Mother     CURRENT MEDICATIONS:  Outpatient Encounter Medications as of 05/02/2021  Medication Sig   Ascorbic Acid (VITAMIN C PO) Take by mouth.   atorvastatin (LIPITOR) 20 MG tablet Take 20 mg by mouth daily.   CALCIUM PO Take by mouth.   Cholecalciferol (VITAMIN D3 PO) Take by mouth.   Cyanocobalamin (VITAMIN B12 PO) Take by mouth.   fluticasone (FLONASE) 50 MCG/ACT nasal spray Place 1 spray into both nostrils daily.   hydrochlorothiazide (HYDRODIURIL) 12.5 MG tablet Take 12.5 mg by mouth daily.   metFORMIN (GLUCOPHAGE-XR) 500 MG 24 hr tablet Take 500 mg by mouth daily.   Potassium Chloride (KLOR-CON 10 PO) Take 10 mEq by mouth daily.   No facility-administered encounter medications on file as of 05/02/2021.    ALLERGIES:  No Known Allergies   PHYSICAL EXAM:  ECOG PERFORMANCE STATUS: 1 - Symptomatic but completely ambulatory  There were no vitals filed for this visit. There were no vitals filed for this visit. Physical Exam Constitutional:      Appearance: Normal appearance. She is obese.  HENT:     Head: Normocephalic and atraumatic.     Mouth/Throat:     Mouth: Mucous membranes are moist.  Eyes:     Extraocular Movements: Extraocular movements intact.     Pupils: Pupils are equal, round, and reactive to light.  Cardiovascular:     Rate and Rhythm: Normal rate and regular rhythm.     Pulses: Normal pulses.     Heart sounds: Normal heart sounds.  Pulmonary:     Effort: Pulmonary effort is normal.     Breath sounds: Normal breath sounds.  Abdominal:     General: Bowel sounds are normal.     Palpations: Abdomen is soft.     Tenderness: There is no abdominal tenderness.  Musculoskeletal:        General: No swelling.     Right lower leg: No edema.     Left lower leg: No edema.  Lymphadenopathy:     Cervical: No cervical adenopathy.  Skin:    General: Skin is warm and dry.  Neurological:     General: No focal deficit present.     Mental Status:  She is alert and oriented to person, place, and time.  Psychiatric:        Mood and Affect: Mood normal.        Behavior: Behavior normal.     LABORATORY DATA:  I have reviewed the labs as listed.  CBC    Component Value Date/Time   WBC 13.0 (H) 04/25/2021 1340   RBC 4.63 04/25/2021 1340   HGB 14.2 04/25/2021 1340   HCT 42.2 04/25/2021 1340   PLT 391 04/25/2021 1340   MCV 91.1 04/25/2021 1340   MCH 30.7 04/25/2021 1340   MCHC 33.6 04/25/2021 1340   RDW 13.5 04/25/2021 1340   LYMPHSABS 4.3 (H)  04/25/2021 1340   MONOABS 1.1 (H) 04/25/2021 1340   EOSABS 0.1 04/25/2021 1340   BASOSABS 0.1 04/25/2021 1340   CMP Latest Ref Rng & Units 08/07/2020  Glucose 70 - 99 mg/dL 95  BUN 8 - 23 mg/dL 16  Creatinine 7.12 - 4.58 mg/dL 0.99  Sodium 833 - 825 mmol/L 136  Potassium 3.5 - 5.1 mmol/L 3.3(L)  Chloride 98 - 111 mmol/L 101  CO2 22 - 32 mmol/L 25  Calcium 8.9 - 10.3 mg/dL 8.9  Total Protein 6.5 - 8.1 g/dL 8.0  Total Bilirubin 0.3 - 1.2 mg/dL 0.6  Alkaline Phos 38 - 126 U/L 97  AST 15 - 41 U/L 17  ALT 0 - 44 U/L 15    DIAGNOSTIC IMAGING:  I have independently reviewed the relevant imaging and discussed with the patient.  ASSESSMENT & PLAN: 1.  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 - Differential shows predominantly lymphocytosis and monocytosis. - JAK2 V617F with reflex to CALR/MPL/JAK2 E12-15 testing on 08/07/2020 was negative - She does not have any history of connective tissue disorders.  She is a non-smoker.  No prior history of splenectomy. - No recurrent infections noted.  No palpable adenopathy or splenomegaly. - No B symptoms.   -Most recent labs (04/25/2021): WBC 13.0 with absolute lymphocytes 4.3 and monocytes 1.1, LDH normal at 131 - Flow cytometry from 04/25/2021 was unable to be processed to the lab error. - PLAN: We will repeat flow cytometry today, will call patient with results.   Otherwise, RTC in 6 months for repeat visit and labs (CBC and LDH)  2.  Thrombocytosis: - Platelet count was as high as 452,000 since March 2020.  JAK2, CALR, MPL testing negative. - No prior history of thrombosis.  No e erythromelalgia or aquagenic pruritus.   - Most recent labs (04/25/2021): Platelets normal at 391 - PLAN: Intermittent thrombocytosis likely reactive.  No additional work-up needed at this time.  3.  Social history: - FAMILY: Mother died of leukemia at age of 53. - PMH: Hypertension, type 2 diabetes mellitus, hyperlipidemia   PLAN SUMMARY & DISPOSITION: - Repeat flow cytometry today, will call patient with results - Labs in 6 months (CBC and LDH) with follow-up visit same day as labs.  All questions were answered. The patient knows to call the clinic with any problems, questions or concerns.  Medical decision making: Low  Time spent on visit: I spent 15 minutes counseling the patient face to face. The total time spent in the appointment was 25 minutes and more than 50% was on counseling.   Carnella Guadalajara, PA-C  05/02/2021 3:14 PM

## 2021-05-02 ENCOUNTER — Other Ambulatory Visit: Payer: Self-pay

## 2021-05-02 ENCOUNTER — Ambulatory Visit (HOSPITAL_COMMUNITY): Payer: Medicare HMO | Admitting: Hematology

## 2021-05-02 ENCOUNTER — Inpatient Hospital Stay (HOSPITAL_COMMUNITY): Payer: Medicare HMO

## 2021-05-02 ENCOUNTER — Inpatient Hospital Stay (HOSPITAL_COMMUNITY): Payer: Medicare HMO | Attending: Hematology | Admitting: Physician Assistant

## 2021-05-02 VITALS — BP 104/68 | HR 101 | Temp 96.9°F | Resp 18 | Wt 206.5 lb

## 2021-05-02 DIAGNOSIS — E119 Type 2 diabetes mellitus without complications: Secondary | ICD-10-CM | POA: Diagnosis not present

## 2021-05-02 DIAGNOSIS — R519 Headache, unspecified: Secondary | ICD-10-CM | POA: Insufficient documentation

## 2021-05-02 DIAGNOSIS — Z596 Low income: Secondary | ICD-10-CM | POA: Diagnosis not present

## 2021-05-02 DIAGNOSIS — D72820 Lymphocytosis (symptomatic): Secondary | ICD-10-CM | POA: Insufficient documentation

## 2021-05-02 DIAGNOSIS — Z90721 Acquired absence of ovaries, unilateral: Secondary | ICD-10-CM | POA: Insufficient documentation

## 2021-05-02 DIAGNOSIS — I10 Essential (primary) hypertension: Secondary | ICD-10-CM | POA: Diagnosis not present

## 2021-05-02 DIAGNOSIS — Z833 Family history of diabetes mellitus: Secondary | ICD-10-CM | POA: Diagnosis not present

## 2021-05-02 DIAGNOSIS — G629 Polyneuropathy, unspecified: Secondary | ICD-10-CM | POA: Diagnosis not present

## 2021-05-02 DIAGNOSIS — D75839 Thrombocytosis, unspecified: Secondary | ICD-10-CM | POA: Insufficient documentation

## 2021-05-02 DIAGNOSIS — Z79899 Other long term (current) drug therapy: Secondary | ICD-10-CM | POA: Insufficient documentation

## 2021-05-02 DIAGNOSIS — R200 Anesthesia of skin: Secondary | ICD-10-CM | POA: Diagnosis not present

## 2021-05-02 DIAGNOSIS — R5383 Other fatigue: Secondary | ICD-10-CM | POA: Diagnosis not present

## 2021-05-02 DIAGNOSIS — E782 Mixed hyperlipidemia: Secondary | ICD-10-CM | POA: Diagnosis not present

## 2021-05-02 DIAGNOSIS — E669 Obesity, unspecified: Secondary | ICD-10-CM | POA: Diagnosis not present

## 2021-05-02 DIAGNOSIS — Z809 Family history of malignant neoplasm, unspecified: Secondary | ICD-10-CM | POA: Insufficient documentation

## 2021-05-02 NOTE — Patient Instructions (Signed)
[facility] at [facility] Discharge Instructions  You were seen today by Rojelio Brenner PA-C for your elevated lymphocytes (high white blood cells).  Your white blood cells remain mildly elevated, but are stable compared to what they were at your last visit 6 months ago.  We do not yet know what is causing this, but it is reassuring that your values have not changed.  This may be reactive related to underlying inflammation.  There is one more test that we need to check, since the first sample that we drew had an error and needs to be retested.  We will check that lab ("flow cytometry") today and we will call you with the results.  As long as the flow cytometry test is normal, we will check you back in 6 months for repeat labs and follow-up visit.  LABS: Labs today.  Repeat labs in 6 months.  MEDICATIONS: No changes to home medications  FOLLOW-UP APPOINTMENT: Office visit in 6 months   Thank you for choosing [facility] at [facility] to provide your oncology and hematology care.  To afford each patient quality time with our provider, please arrive at least 15 minutes before your scheduled appointment time.   If you have a lab appointment with the Cancer Center please come in thru the Main Entrance and check in at the main information desk.  You need to re-schedule your appointment should you arrive 10 or more minutes late.  We strive to give you quality time with our providers, and arriving late affects you and other patients whose appointments are after yours.  Also, if you no show three or more times for appointments you may be dismissed from the clinic at the providers discretion.     Again, thank you for choosing [facility].  Our hope is that these requests will decrease the amount of time that you wait before being seen by our physicians.       _____________________________________________________________  Should you have  questions after your visit to [facility], please contact our office at [phone] and follow the prompts.  Our office hours are 8:00 a.m. and 4:30 p.m. Monday - Friday.  Please note that voicemails left after 4:00 p.m. may not be returned until the following business day.  We are closed weekends and major holidays.  You do have access to a nurse 24-7, just call the main number to the clinic [phone] and do not press any options, hold on the line and a nurse will answer the phone.    For prescription refill requests, have your pharmacy contact our office and allow 72 hours.    Due to Covid, you will need to wear a mask upon entering the hospital. If you do not have a mask, a mask will be given to you at the Main Entrance upon arrival. For doctor visits, patients may have 1 support person age 39 or older with them. For treatment visits, patients can not have anyone with them due to social distancing guidelines and our immunocompromised population.

## 2021-05-03 LAB — SURGICAL PATHOLOGY

## 2021-05-17 ENCOUNTER — Inpatient Hospital Stay (HOSPITAL_COMMUNITY): Admission: RE | Admit: 2021-05-17 | Payer: Medicare HMO | Source: Ambulatory Visit

## 2021-06-11 ENCOUNTER — Ambulatory Visit (HOSPITAL_COMMUNITY)
Admission: RE | Admit: 2021-06-11 | Discharge: 2021-06-11 | Disposition: A | Discharge status: Home / Self Care | Payer: Medicare HMO | Source: Ambulatory Visit | Attending: Family Medicine | Admitting: Family Medicine

## 2021-06-11 ENCOUNTER — Other Ambulatory Visit: Payer: Self-pay

## 2021-06-11 DIAGNOSIS — Z1231 Encounter for screening mammogram for malignant neoplasm of breast: Secondary | ICD-10-CM | POA: Diagnosis not present

## 2021-08-09 ENCOUNTER — Other Ambulatory Visit (HOSPITAL_COMMUNITY): Payer: Self-pay | Admitting: Physician Assistant

## 2021-08-09 DIAGNOSIS — D72820 Lymphocytosis (symptomatic): Secondary | ICD-10-CM

## 2021-08-09 LAB — FLOW CYTOMETRY

## 2021-09-26 ENCOUNTER — Encounter: Payer: Self-pay | Admitting: Internal Medicine

## 2021-10-01 ENCOUNTER — Other Ambulatory Visit: Payer: Self-pay

## 2021-10-01 ENCOUNTER — Encounter (HOSPITAL_COMMUNITY): Payer: Self-pay

## 2021-10-01 ENCOUNTER — Emergency Department (HOSPITAL_COMMUNITY)
Admission: EM | Admit: 2021-10-01 | Discharge: 2021-10-01 | Disposition: A | Discharge status: Home / Self Care | Payer: Medicare HMO | Attending: Emergency Medicine | Admitting: Emergency Medicine

## 2021-10-01 ENCOUNTER — Emergency Department (HOSPITAL_COMMUNITY): Payer: Medicare HMO

## 2021-10-01 DIAGNOSIS — R1084 Generalized abdominal pain: Secondary | ICD-10-CM

## 2021-10-01 DIAGNOSIS — Z87891 Personal history of nicotine dependence: Secondary | ICD-10-CM | POA: Insufficient documentation

## 2021-10-01 DIAGNOSIS — R109 Unspecified abdominal pain: Secondary | ICD-10-CM | POA: Diagnosis present

## 2021-10-01 DIAGNOSIS — R531 Weakness: Secondary | ICD-10-CM | POA: Diagnosis not present

## 2021-10-01 DIAGNOSIS — R112 Nausea with vomiting, unspecified: Secondary | ICD-10-CM | POA: Diagnosis not present

## 2021-10-01 DIAGNOSIS — R634 Abnormal weight loss: Secondary | ICD-10-CM | POA: Diagnosis not present

## 2021-10-01 LAB — URINALYSIS, ROUTINE W REFLEX MICROSCOPIC
Bilirubin Urine: NEGATIVE
Glucose, UA: NEGATIVE mg/dL
Ketones, ur: 80 mg/dL — AB
Leukocytes,Ua: NEGATIVE
Nitrite: NEGATIVE
Protein, ur: NEGATIVE mg/dL
RBC / HPF: >50 RBC/hpf — ABNORMAL HIGH (ref 0–5)
Specific Gravity, Urine: 1.005 (ref 1.005–1.030)
pH: 6 (ref 5.0–8.0)

## 2021-10-01 LAB — LIPASE, BLOOD: Lipase: 31 U/L (ref 11–51)

## 2021-10-01 LAB — CBC WITH DIFFERENTIAL/PLATELET
Abs Immature Granulocytes: 0.04 10*3/uL (ref 0.00–0.07)
Basophils Absolute: 0 10*3/uL (ref 0.0–0.1)
Basophils Relative: 0 %
Eosinophils Absolute: 0 10*3/uL (ref 0.0–0.5)
Eosinophils Relative: 0 %
HCT: 46.4 % — ABNORMAL HIGH (ref 36.0–46.0)
Hemoglobin: 15.7 g/dL — ABNORMAL HIGH (ref 12.0–15.0)
Immature Granulocytes: 0 %
Lymphocytes Relative: 22 %
Lymphs Abs: 3 10*3/uL (ref 0.7–4.0)
MCH: 31.6 pg (ref 26.0–34.0)
MCHC: 33.8 g/dL (ref 30.0–36.0)
MCV: 93.4 fL (ref 80.0–100.0)
Monocytes Absolute: 0.8 10*3/uL (ref 0.1–1.0)
Monocytes Relative: 6 %
Neutro Abs: 9.6 10*3/uL — ABNORMAL HIGH (ref 1.7–7.7)
Neutrophils Relative %: 72 %
Platelets: 390 10*3/uL (ref 150–400)
RBC: 4.97 MIL/uL (ref 3.87–5.11)
RDW: 13.3 % (ref 11.5–15.5)
WBC: 13.5 10*3/uL — ABNORMAL HIGH (ref 4.0–10.5)
nRBC: 0 % (ref 0.0–0.2)

## 2021-10-01 LAB — TROPONIN I (HIGH SENSITIVITY)
Troponin I (High Sensitivity): 3 ng/L (ref <18)
Troponin I (High Sensitivity): 3 ng/L (ref <18)

## 2021-10-01 LAB — COMPREHENSIVE METABOLIC PANEL
ALT: 15 U/L (ref 0–44)
AST: 16 U/L (ref 15–41)
Albumin: 4.1 g/dL (ref 3.5–5.0)
Alkaline Phosphatase: 99 U/L (ref 38–126)
Anion gap: 10 (ref 5–15)
BUN: 17 mg/dL (ref 8–23)
CO2: 29 mmol/L (ref 22–32)
Calcium: 9.3 mg/dL (ref 8.9–10.3)
Chloride: 98 mmol/L (ref 98–111)
Creatinine, Ser: 0.71 mg/dL (ref 0.44–1.00)
GFR, Estimated: >60 mL/min (ref >60)
Glucose, Bld: 103 mg/dL — ABNORMAL HIGH (ref 70–99)
Potassium: 4.3 mmol/L (ref 3.5–5.1)
Sodium: 137 mmol/L (ref 135–145)
Total Bilirubin: 0.5 mg/dL (ref 0.3–1.2)
Total Protein: 8.2 g/dL — ABNORMAL HIGH (ref 6.5–8.1)

## 2021-10-01 MED ORDER — LACTATED RINGERS IV BOLUS
1000.0000 mL | Freq: Once | INTRAVENOUS | Status: AC
Start: 2021-10-01 — End: 2021-10-01
  Administered 2021-10-01: 1000 mL via INTRAVENOUS

## 2021-10-01 MED ORDER — PANTOPRAZOLE SODIUM 40 MG PO TBEC: 40.0000 mg | DELAYED_RELEASE_TABLET | Freq: Every day | ORAL | 0 refills | Status: DC

## 2021-10-01 MED ORDER — FAMOTIDINE IN NACL 20-0.9 MG/50ML-% IV SOLN
20.0000 mg | Freq: Once | INTRAVENOUS | Status: DC
  Filled 2021-10-01: qty 50

## 2021-10-01 MED ORDER — IOHEXOL 350 MG/ML SOLN
80.0000 mL | Freq: Once | INTRAVENOUS | Status: AC | PRN
  Administered 2021-10-01: 80 mL via INTRAVENOUS

## 2021-10-01 MED ORDER — ONDANSETRON HCL 4 MG/2ML IJ SOLN: 4.0000 mg | Freq: Once | INTRAMUSCULAR | Status: DC

## 2021-10-01 MED ORDER — ONDANSETRON 4 MG PO TBDP
4.0000 mg | ORAL_TABLET | Freq: Once | ORAL | Status: AC
  Administered 2021-10-01: 4 mg via ORAL
  Filled 2021-10-01: qty 1

## 2021-10-01 MED ORDER — ALUM & MAG HYDROXIDE-SIMETH 200-200-20 MG/5ML PO SUSP
15.0000 mL | Freq: Once | ORAL | Status: AC
  Administered 2021-10-01: 15 mL via ORAL
  Filled 2021-10-01: qty 30

## 2021-10-01 NOTE — ED Provider Notes (Signed)
[facility] EMERGENCY DEPARTMENT Provider Note   CSN: [id number] Arrival date & time: 10/01/21  0913     History  Chief Complaint  Patient presents with   Weakness    Deborah Todd is a 71 y.o. female.  Patient is a 71 yo female presenting for generalized weakness, abdominal pain, nausea, vomiting, decreased appetite, and difficulty swallowing x 1 month. Admits to 10-15 lb unintentional weight loss in past 6 months. Denies previous dx of cancer. Dx hx of smoking or cough. Denies rectal  bleeding or melanotic stools. Denies fevers or night sweats.   The history is provided by the patient. No language interpreter was used.  Weakness Associated symptoms: abdominal pain, nausea and vomiting   Associated symptoms: no arthralgias, no chest pain, no cough, no dysuria, no fever, no seizures and no shortness of breath       Home Medications Prior to Admission medications   Medication Sig Start Date End Date Taking? Authorizing Provider  Ascorbic Acid (VITAMIN C PO) Take by mouth.    [provider]  atorvastatin (LIPITOR) 20 MG tablet Take 20 mg by mouth daily. 07/06/20   [provider]  CALCIUM PO Take by mouth.    [provider]  Cholecalciferol (VITAMIN D3 PO) Take by mouth.    [provider]  Cyanocobalamin (VITAMIN B12 PO) Take by mouth.    [provider]  fluticasone (FLONASE) 50 MCG/ACT nasal spray Place 1 spray into both nostrils daily. Patient not taking: Reported on 05/02/2021 09/15/20   [provider]  hydrochlorothiazide (HYDRODIURIL) 12.5 MG tablet Take 12.5 mg by mouth daily. 07/06/20   [provider]  metFORMIN (GLUCOPHAGE-XR) 500 MG 24 hr tablet Take 500 mg by mouth daily. 07/03/20   [provider]  Potassium Chloride (KLOR-CON 10 PO) Take 10 mEq by mouth daily.    [provider]      Allergies    Patient has no known allergies.    Review of Systems   Review of Systems   Constitutional:  Positive for appetite change, fatigue and unexpected weight change. Negative for chills and fever.  HENT:  Negative for ear pain and sore throat.   Eyes:  Negative for pain and visual disturbance.  Respiratory:  Negative for cough and shortness of breath.   Cardiovascular:  Negative for chest pain and palpitations.  Gastrointestinal:  Positive for abdominal pain, nausea and vomiting.  Genitourinary:  Negative for dysuria and hematuria.  Musculoskeletal:  Negative for arthralgias and back pain.  Skin:  Negative for color change and rash.  Neurological:  Positive for weakness. Negative for seizures and syncope.  All other systems reviewed and are negative.  Physical Exam Updated Vital Signs BP 118/74 (BP Location: Right Arm)    Pulse 85    Temp 98.6 F (37 C) (Oral)    Resp (!) 22    Ht 5\' 4"  (1.626 m)    Wt 93.7 kg    SpO2 100%    BMI 35.46 kg/m  Physical Exam Vitals and nursing note reviewed.  Constitutional:      General: She is not in acute distress.    Appearance: She is well-developed.  HENT:     Head: Normocephalic and atraumatic.  Eyes:     Conjunctiva/sclera: Conjunctivae normal.  Cardiovascular:     Rate and Rhythm: Normal rate and regular rhythm.     Heart sounds: No murmur heard. Pulmonary:     Effort: Pulmonary effort is normal. No  respiratory distress.     Breath sounds: Normal breath sounds.  Abdominal:     Palpations: Abdomen is soft.     Tenderness: There is no abdominal tenderness.  Musculoskeletal:        General: No swelling.     Cervical back: Neck supple.  Skin:    General: Skin is warm and dry.     Capillary Refill: Capillary refill takes less than 2 seconds.  Neurological:     Mental Status: She is alert.  Psychiatric:        Mood and Affect: Mood normal.    ED Results / Procedures / Treatments   Labs (all labs ordered are listed, but only abnormal results are displayed) Labs Reviewed  CBC WITH DIFFERENTIAL/PLATELET -  Abnormal; Notable for the following components:      Result Value   WBC 13.5 (*)    Hemoglobin 15.7 (*)    HCT 46.4 (*)    Neutro Abs 9.6 (*)    All other components within normal limits  COMPREHENSIVE METABOLIC PANEL  LIPASE, BLOOD  URINALYSIS, ROUTINE W REFLEX MICROSCOPIC  TROPONIN I (HIGH SENSITIVITY)    EKG None  Radiology No results found.  Procedures Procedures    Medications Ordered in ED Medications  famotidine (PEPCID) IVPB 20 mg premix (has no administration in time range)  alum & mag hydroxide-simeth (MAALOX/MYLANTA) 200-200-20 MG/5ML suspension 15 mL (has no administration in time range)  ondansetron (ZOFRAN-ODT) disintegrating tablet 4 mg (has no administration in time range)    ED Course/ Medical Decision Making/ A&P Clinical Course as of 10/02/21 1716  Mon Oct 01, 2021  2230 Patient has just now made urine which shows some ketones but no signs of infection. She is feeling better after IVF and BP is improved. She would like to go home. Will switch PPI to Protonix. Referral to GI as an outpatient. RTED for any other concerns [CS]    Clinical Course User Index [CS] Truddie Hidden, MD                           Medical Decision Making  12:51 PM  71 yo female presenting for generalized weakness, abdominal pain, nausea, vomiting, decreased appetite, and difficulty swallowing x 1 month. Patient is Aox3, no acute distress, afebrile, with stable vitals. Physical exam demonstrate soft nontender abdomen. Zofran given for symptomatic management. Patient NPO.   Lab studies demonstrate stable liver profile and renal function. No s/s sepsis. Concern for malignancy due to patient's unintentional weight loss and appetite changes. Pt signed out to oncoming physician while awaiting CT results.          Final Clinical Impression(s) / ED Diagnoses Final diagnoses:  Generalized weakness  Weight loss, unintentional  Generalized abdominal pain  Nausea and vomiting,  unspecified vomiting type    Rx / DC Orders ED Discharge Orders     None         Lianne Cure, DO [phone]

## 2021-10-01 NOTE — ED Triage Notes (Signed)
Patient reports N/V, generalized weakness, decreased appetite, difficulty swallowing for the past month. States that she has been for same thing and told she had GERD.

## 2021-10-01 NOTE — ED Provider Notes (Signed)
Care of the patient assumed at the change of shift after transport to [facility] for CT. She is having occasional vomiting and dysphagia. Has not seen GI. Is taking Omeprazole with minimal improvement. Has not made urine yet. Will give IVF and recheck.  Physical Exam  BP 137/85    Pulse 75    Temp 98.6 F (37 C) (Oral)    Resp 18    Ht 5\' 4"  (1.626 m)    Wt 93.7 kg    SpO2 97%    BMI 35.46 kg/m   Physical Exam  ED Course/Procedures   Clinical Course as of 10/01/21 2231  Mon Oct 01, 2021  2230 Patient has just now made urine which shows some ketones but no signs of infection. She is feeling better after IVF and BP is improved. She would like to go home. Will switch PPI to Protonix. Referral to GI as an outpatient. RTED for any other concerns [CS]    Clinical Course User Index [CS] 2231, MD    Procedures  MDM         Pollyann Savoy, MD 10/01/21 2231

## 2021-10-30 ENCOUNTER — Ambulatory Visit (INDEPENDENT_AMBULATORY_CARE_PROVIDER_SITE_OTHER): Payer: Medicare HMO | Admitting: Gastroenterology

## 2021-10-30 ENCOUNTER — Other Ambulatory Visit: Payer: Self-pay

## 2021-10-30 ENCOUNTER — Encounter: Payer: Self-pay | Admitting: Gastroenterology

## 2021-10-30 VITALS — BP 109/71 | HR 111 | Temp 97.3°F | Ht 63.0 in | Wt 179.4 lb

## 2021-10-30 DIAGNOSIS — R7301 Impaired fasting glucose: Secondary | ICD-10-CM | POA: Insufficient documentation

## 2021-10-30 DIAGNOSIS — R1084 Generalized abdominal pain: Secondary | ICD-10-CM

## 2021-10-30 DIAGNOSIS — R2689 Other abnormalities of gait and mobility: Secondary | ICD-10-CM | POA: Insufficient documentation

## 2021-10-30 DIAGNOSIS — R194 Change in bowel habit: Secondary | ICD-10-CM | POA: Diagnosis not present

## 2021-10-30 DIAGNOSIS — Z90710 Acquired absence of both cervix and uterus: Secondary | ICD-10-CM | POA: Insufficient documentation

## 2021-10-30 DIAGNOSIS — J329 Chronic sinusitis, unspecified: Secondary | ICD-10-CM | POA: Insufficient documentation

## 2021-10-30 DIAGNOSIS — E119 Type 2 diabetes mellitus without complications: Secondary | ICD-10-CM | POA: Insufficient documentation

## 2021-10-30 DIAGNOSIS — K219 Gastro-esophageal reflux disease without esophagitis: Secondary | ICD-10-CM | POA: Insufficient documentation

## 2021-10-30 DIAGNOSIS — R634 Abnormal weight loss: Secondary | ICD-10-CM

## 2021-10-30 DIAGNOSIS — R1013 Epigastric pain: Secondary | ICD-10-CM

## 2021-10-30 DIAGNOSIS — M25569 Pain in unspecified knee: Secondary | ICD-10-CM | POA: Insufficient documentation

## 2021-10-30 DIAGNOSIS — D72829 Elevated white blood cell count, unspecified: Secondary | ICD-10-CM | POA: Insufficient documentation

## 2021-10-30 DIAGNOSIS — Z0000 Encounter for general adult medical examination without abnormal findings: Secondary | ICD-10-CM | POA: Insufficient documentation

## 2021-10-30 MED ORDER — NA SULFATE-K SULFATE-MG SULF 17.5-3.13-1.6 GM/177ML PO SOLN
354.0000 mL | Freq: Once | ORAL | 0 refills | Status: AC
Start: 2021-10-30 — End: 2021-10-30

## 2021-10-30 MED ORDER — OMEPRAZOLE 20 MG PO CPDR: 20.0000 mg | DELAYED_RELEASE_CAPSULE | Freq: Two times a day (BID) | ORAL | 3 refills | Status: AC

## 2021-10-30 NOTE — Progress Notes (Signed)
Wyline Mood MD, MRCP(U.K) [address]  [address]  [place], [place] [address]  Main: [phone]  Fax: [phone]   Gastroenterology Consultation  Referring Provider:     [facility] Primary Care Physician:  [facility] Primary Gastroenterologist:  Dr. Wyline Mood  Reason for Consultation:     weight loss        HPI:   Deborah Todd is a 71 y.o. y/o female referred for weight loss, abdominal pain.  She states that she has had issues with poor appetite weight loss of over 20 pounds with 2 months.  States that she has had a few episodes of vomiting.  Irregular bowel movements.  She may go for few days without having a bowel movement.  Does not eat much.  Feels full with a few bites.  No inclination to eat.  Last colonoscopy was many years back.  Does not recollect having any polyps. Recent ER visit on 10/01/2021: Presented with abdominal pain , nausea,vomiting and dysphagia. CT abdomen: Nil acute.CBC, CMP -normal . UA shows multiple RBC's , bacteria , small hb .     Past Medical History:  Diagnosis Date   Arthritis    Essential hypertension    Mixed hyperlipidemia    Sciatica    Type 2 diabetes mellitus (HCC)     Past Surgical History:  Procedure Laterality Date   ABDOMINAL HYSTERECTOMY     Oophorectomy     TUBAL LIGATION      Prior to Admission medications   Medication Sig Start Date End Date Taking? Authorizing Provider  Ascorbic Acid (VITAMIN C PO) Take by mouth.    [provider]  atorvastatin (LIPITOR) 20 MG tablet Take 20 mg by mouth daily. 07/06/20   [provider]  CALCIUM PO Take by mouth.    [provider]  Cholecalciferol (VITAMIN D3 PO) Take by mouth.    [provider]  Cyanocobalamin (VITAMIN B12 PO) Take by mouth.    [provider]  fluticasone (FLONASE) 50 MCG/ACT nasal spray Place 1 spray into both nostrils daily. Patient not taking: Reported on 05/02/2021  09/15/20   [provider]  hydrochlorothiazide (HYDRODIURIL) 12.5 MG tablet Take 12.5 mg by mouth daily. 07/06/20   [provider]  metFORMIN (GLUCOPHAGE-XR) 500 MG 24 hr tablet Take 500 mg by mouth daily. 07/03/20   [provider]  pantoprazole (PROTONIX) 40 MG tablet Take 1 tablet (40 mg total) by mouth daily. 10/01/21   Pollyann Savoy, MD  Potassium Chloride (KLOR-CON 10 PO) Take 10 mEq by mouth daily.    [provider]    Family History  Problem Relation Age of Onset   Cancer Mother    Diabetes Mellitus II Mother      Social History   Tobacco Use   Smoking status: Never   Smokeless tobacco: Never  Vaping Use   Vaping Use: Never used  Substance Use Topics   Alcohol use: Never   Drug use: Never    Allergies as of 10/30/2021   (No Known Allergies)    Review of Systems:    All systems reviewed and negative except where noted in HPI.   Physical Exam:  BP 109/71    Pulse (!) 111    Temp (!) 97.3 F (36.3 C) (Oral)    Ht 5\' 3"  (1.6 m)    Wt 179 lb 6.4 oz (81.4 kg)    BMI 31.78 kg/m  No LMP recorded. Patient has had a hysterectomy. Psych:  Alert and cooperative. Normal mood and affect. General:   Alert,  Well-developed, well-nourished, pleasant and cooperative in NAD Head:  Normocephalic and atraumatic. Eyes:  Sclera clear, no icterus.   Conjunctiva pink. Ears:  Normal auditory acuity. Nose:  No deformity, discharge, or lesions. Lungs:  Respirations even and unlabored.  Clear throughout to auscultation.   No wheezes, crackles, or rhonchi. No acute distress. Heart:  Regular rate and rhythm; no murmurs, clicks, rubs, or gallops. Abdomen:  Normal bowel sounds.  No bruits.  Soft, non-tender and non-distended without masses, hepatosplenomegaly or hernias noted.  No guarding or rebound tenderness.    Extremities:  No clubbing or edema.  No cyanosis. Neurologic:  Alert and oriented x3;  grossly normal neurologically. Psych:  Alert and  cooperative. Normal mood and affect.  Imaging Studies: CT ABDOMEN PELVIS W CONTRAST  Result Date: 10/01/2021 CLINICAL DATA:  Nausea and vomiting. Abdominal pain and weight loss. EXAM: CT ABDOMEN AND PELVIS WITH CONTRAST TECHNIQUE: Multidetector CT imaging of the abdomen and pelvis was performed using the standard protocol following bolus administration of intravenous contrast. CONTRAST:  33mL OMNIPAQUE IOHEXOL 350 MG/ML SOLN COMPARISON:  None. FINDINGS: Lower chest: There is atelectasis in the right lung base. Hepatobiliary: No focal liver abnormality is seen. No gallstones, gallbladder wall thickening, or biliary dilatation. Pancreas: Unremarkable. No pancreatic ductal dilatation or surrounding inflammatory changes. Spleen: Normal in size without focal abnormality. Adrenals/Urinary Tract: Adrenal glands are unremarkable. Kidneys are normal, without renal calculi, focal lesion, or hydronephrosis. Bladder is unremarkable. Stomach/Bowel: Stomach is within normal limits. Appendix appears normal. No evidence of bowel wall thickening, distention, or inflammatory changes. Vascular/Lymphatic: No significant vascular findings are present. No enlarged abdominal or pelvic lymph nodes. Reproductive: Status post hysterectomy. No adnexal masses. Other: There is no ascites. There is mild elevation of the left hemidiaphragm. Musculoskeletal: Degenerative changes affect the spine and sacroiliac joints. No acute fractures. IMPRESSION: 1. No acute localizing process in the abdomen or pelvis. Electronically Signed   By: Darliss Cheney M.D.   On: 10/01/2021 18:07    Assessment and Plan:   Deborah Todd is a 71 y.o. y/o female has been referred for unintentional weight loss of over 20 pounds over the past few months, dyspeptic symptoms and change in bowel habits.  Plan 1.  Check CBC, CMP, TSH 2.  EGD and colonoscopy within 2 weeks. 3.  Commenced on Linzess 145 mcg for constipation.  Samples will be provided for a week if  no better will increase the dose. 4.  Commence on Prilosec 20 mg twice daily for dyspepsia. 5.  If no better at next visit continues to lose weight and not seen any improvement we will scan  with CT chest abdomen and pelvis.    I have discussed alternative options, risks & benefits,  which include, but are not limited to, bleeding, infection, perforation,respiratory complication & drug reaction.  The patient agrees with this plan & written consent will be obtained.     Follow up in 2-3 weeks  Dr Wyline Mood MD,MRCP(U.K)

## 2021-10-31 LAB — COMPREHENSIVE METABOLIC PANEL
ALT: 11 IU/L (ref 0–32)
AST: 15 IU/L (ref 0–40)
Albumin/Globulin Ratio: 1.4 (ref 1.2–2.2)
Albumin: 4.2 g/dL (ref 3.8–4.8)
Alkaline Phosphatase: 113 IU/L (ref 44–121)
BUN/Creatinine Ratio: 15 (ref 12–28)
BUN: 11 mg/dL (ref 8–27)
Bilirubin Total: 0.3 mg/dL (ref 0.0–1.2)
CO2: 24 mmol/L (ref 20–29)
Calcium: 9.7 mg/dL (ref 8.7–10.3)
Chloride: 99 mmol/L (ref 96–106)
Creatinine, Ser: 0.72 mg/dL (ref 0.57–1.00)
Globulin, Total: 3 g/dL (ref 1.5–4.5)
Glucose: 120 mg/dL — ABNORMAL HIGH (ref 70–99)
Potassium: 4.3 mmol/L (ref 3.5–5.2)
Sodium: 138 mmol/L (ref 134–144)
Total Protein: 7.2 g/dL (ref 6.0–8.5)
eGFR: 90 mL/min/{1.73_m2} (ref >59)

## 2021-10-31 LAB — CBC
Hematocrit: 43.9 % (ref 34.0–46.6)
Hemoglobin: 14.9 g/dL (ref 11.1–15.9)
MCH: 30.2 pg (ref 26.6–33.0)
MCHC: 33.9 g/dL (ref 31.5–35.7)
MCV: 89 fL (ref 79–97)
Platelets: 406 10*3/uL (ref 150–450)
RBC: 4.94 x10E6/uL (ref 3.77–5.28)
RDW: 13.4 % (ref 11.7–15.4)
WBC: 11.6 10*3/uL — ABNORMAL HIGH (ref 3.4–10.8)

## 2021-10-31 LAB — TSH: TSH: 1.65 u[IU]/mL (ref 0.450–4.500)

## 2021-11-02 ENCOUNTER — Inpatient Hospital Stay (HOSPITAL_COMMUNITY): Payer: Medicare HMO

## 2021-11-09 ENCOUNTER — Telehealth (HOSPITAL_COMMUNITY): Payer: Medicare HMO | Admitting: Physician Assistant

## 2021-11-12 ENCOUNTER — Ambulatory Visit
Admission: RE | Admit: 2021-11-12 | Discharge: 2021-11-12 | Disposition: A | Discharge status: Home / Self Care | Payer: Medicare HMO | Source: Ambulatory Visit | Attending: Gastroenterology | Admitting: Gastroenterology

## 2021-11-12 ENCOUNTER — Encounter: Payer: Self-pay | Admitting: Gastroenterology

## 2021-11-12 ENCOUNTER — Ambulatory Visit: Payer: Medicare HMO | Admitting: Anesthesiology

## 2021-11-12 ENCOUNTER — Encounter
Admission: RE | Disposition: A | Discharge status: Home / Self Care | Payer: Self-pay | Source: Ambulatory Visit | Attending: Gastroenterology

## 2021-11-12 DIAGNOSIS — Z87891 Personal history of nicotine dependence: Secondary | ICD-10-CM | POA: Diagnosis not present

## 2021-11-12 DIAGNOSIS — I10 Essential (primary) hypertension: Secondary | ICD-10-CM | POA: Insufficient documentation

## 2021-11-12 DIAGNOSIS — R599 Enlarged lymph nodes, unspecified: Secondary | ICD-10-CM | POA: Insufficient documentation

## 2021-11-12 DIAGNOSIS — K219 Gastro-esophageal reflux disease without esophagitis: Secondary | ICD-10-CM | POA: Insufficient documentation

## 2021-11-12 DIAGNOSIS — R1013 Epigastric pain: Secondary | ICD-10-CM

## 2021-11-12 DIAGNOSIS — R194 Change in bowel habit: Secondary | ICD-10-CM

## 2021-11-12 DIAGNOSIS — K449 Diaphragmatic hernia without obstruction or gangrene: Secondary | ICD-10-CM | POA: Diagnosis not present

## 2021-11-12 DIAGNOSIS — K635 Polyp of colon: Secondary | ICD-10-CM | POA: Insufficient documentation

## 2021-11-12 DIAGNOSIS — K317 Polyp of stomach and duodenum: Secondary | ICD-10-CM | POA: Diagnosis not present

## 2021-11-12 DIAGNOSIS — R634 Abnormal weight loss: Secondary | ICD-10-CM | POA: Insufficient documentation

## 2021-11-12 DIAGNOSIS — G709 Myoneural disorder, unspecified: Secondary | ICD-10-CM | POA: Diagnosis not present

## 2021-11-12 DIAGNOSIS — E119 Type 2 diabetes mellitus without complications: Secondary | ICD-10-CM | POA: Diagnosis not present

## 2021-11-12 DIAGNOSIS — E782 Mixed hyperlipidemia: Secondary | ICD-10-CM | POA: Diagnosis not present

## 2021-11-12 HISTORY — PX: ESOPHAGOGASTRODUODENOSCOPY: SHX5428

## 2021-11-12 HISTORY — PX: COLONOSCOPY WITH PROPOFOL: SHX5780

## 2021-11-12 LAB — GLUCOSE, CAPILLARY: Glucose-Capillary: 112 mg/dL — ABNORMAL HIGH (ref 70–99)

## 2021-11-12 SURGERY — COLONOSCOPY WITH PROPOFOL
Anesthesia: General

## 2021-11-12 MED ORDER — LIDOCAINE HCL (CARDIAC) PF 100 MG/5ML IV SOSY
PREFILLED_SYRINGE | INTRAVENOUS | Status: DC | PRN
  Administered 2021-11-12: 100 mg via INTRAVENOUS

## 2021-11-12 MED ORDER — PROPOFOL 10 MG/ML IV BOLUS
INTRAVENOUS | Status: DC | PRN
Start: 2021-11-12 — End: 2021-11-12
  Administered 2021-11-12: 60 mg via INTRAVENOUS

## 2021-11-12 MED ORDER — PROPOFOL 500 MG/50ML IV EMUL
INTRAVENOUS | Status: DC | PRN
  Administered 2021-11-12: 150 ug/kg/min via INTRAVENOUS

## 2021-11-12 MED ORDER — SODIUM CHLORIDE 0.9 % IV SOLN: INTRAVENOUS | Status: DC

## 2021-11-12 NOTE — Op Note (Signed)
[facility] Gastroenterology Patient Name: Liam Bossman Procedure Date: 11/12/2021 10:17 AM MRN: [id number] Account #: [id number] Date of Birth: 1950/12/06 Admit Type: Outpatient Age: 71 Room: [facility] ENDO ROOM 4 Gender: Female Note Status: Finalized Instrument Name: Upper Endoscope [id number] Procedure:             Upper GI endoscopy Indications:           Weight loss Providers:             Wyline Mood MD, MD Referring MD:          [facility] [place] (Referring MD) Medicines:             Monitored Anesthesia Care Complications:         No immediate complications. Procedure:             Pre-Anesthesia Assessment:                        - Prior to the procedure, a History and Physical was                         performed, and patient medications, allergies and                         sensitivities were reviewed. The patient's tolerance                         of previous anesthesia was reviewed.                        - The risks and benefits of the procedure and the                         sedation options and risks were discussed with the                         patient. All questions were answered and informed                         consent was obtained.                        - ASA Grade Assessment: III - A patient with severe                         systemic disease.                        After obtaining informed consent, the endoscope was                         passed under direct vision. Throughout the procedure,                         the patient's blood pressure, pulse, and oxygen                         saturations were monitored continuously. The                         Endosonoscope was  introduced through the mouth, and                         advanced to the third part of duodenum. The upper GI                         endoscopy was accomplished with ease. The patient                         tolerated the procedure well. Findings:       The esophagus was normal.      The examined duodenum was normal.      A large hiatal hernia was present.      A single 12 mm sessile polyp with no bleeding and no stigmata of recent       bleeding was found in the prepyloric region of the stomach. The polyp       was removed with a hot snare. Resection and retrieval were complete. To       prevent bleeding after the polypectomy, one hemostatic clip was       successfully placed. There was no bleeding during, or at the end, of the       procedure.      The cardia and gastric fundus were normal on retroflexion. Impression:            - Normal esophagus.                        - Normal examined duodenum.                        - Large hiatal hernia.                        - A single gastric polyp. Resected and retrieved. Clip                         was placed. Recommendation:        - Await pathology results.                        - Perform a colonoscopy today. Procedure Code(s):     --- Professional ---                        [phone], Esophagogastroduodenoscopy, flexible,                         transoral; with removal of tumor(s), polyp(s), or                         other lesion(s) by snare technique Diagnosis Code(s):     --- Professional ---                        K44.9, Diaphragmatic hernia without obstruction or                         gangrene                        K31.7, Polyp of stomach and duodenum  R63.4, Abnormal weight loss CPT copyright 2019 American Medical Association. All rights reserved. The codes documented in this report are preliminary and upon coder review may  be revised to meet current compliance requirements. Wyline Mood, MD Wyline Mood MD, MD 11/12/2021 10:32:48 AM This report has been signed electronically. Number of Addenda: 0 Note Initiated On: 11/12/2021 10:17 AM Estimated Blood Loss:  Estimated blood loss: none.      [facility]

## 2021-11-12 NOTE — Anesthesia Preprocedure Evaluation (Signed)
Anesthesia Evaluation  Patient identified by MRN, date of birth, ID band Patient awake    Reviewed: Allergy & Precautions, NPO status , Patient's Chart, lab work & pertinent test results  History of Anesthesia Complications Negative for: history of anesthetic complications  Airway Mallampati: II  TM Distance: >3 FB Neck ROM: Full    Dental no notable dental hx. (+) Teeth Intact   Pulmonary neg pulmonary ROS, neg sleep apnea, neg COPD, Patient abstained from smoking.Not current smoker,    Pulmonary exam normal breath sounds clear to auscultation       Cardiovascular Exercise Tolerance: Good METShypertension, Pt. on medications (-) CAD and (-) Past MI (-) dysrhythmias  Rhythm:Regular Rate:Normal - Systolic murmurs    Neuro/Psych  Neuromuscular disease negative psych ROS   GI/Hepatic GERD  ,(+)     (-) substance abuse  ,   Endo/Other  diabetes  Renal/GU negative Renal ROS     Musculoskeletal  (+) Arthritis ,   Abdominal   Peds  Hematology   Anesthesia Other Findings Past Medical History: No date: Arthritis No date: Essential hypertension No date: Mixed hyperlipidemia No date: Sciatica No date: Type 2 diabetes mellitus (HCC)  Reproductive/Obstetrics                             Anesthesia Physical Anesthesia Plan  ASA: 2  Anesthesia Plan: General   Post-op Pain Management: Minimal or no pain anticipated   Induction: Intravenous  PONV Risk Score and Plan: 3 and Propofol infusion, TIVA and Ondansetron  Airway Management Planned: Nasal Cannula  Additional Equipment: None  Intra-op Plan:   Post-operative Plan:   Informed Consent: I have reviewed the patients History and Physical, chart, labs and discussed the procedure including the risks, benefits and alternatives for the proposed anesthesia with the patient or authorized representative who has indicated his/her understanding  and acceptance.     Dental advisory given  Plan Discussed with: CRNA and Surgeon  Anesthesia Plan Comments: (Discussed risks of anesthesia with patient, including possibility of difficulty with spontaneous ventilation under anesthesia necessitating airway intervention, PONV, and rare risks such as cardiac or respiratory or neurological events, and allergic reactions. Discussed the role of CRNA in patient's perioperative care. Patient understands.)        Anesthesia Quick Evaluation

## 2021-11-12 NOTE — Anesthesia Procedure Notes (Signed)
Procedure Name: MAC Date/Time: 11/12/2021 10:22 AM Performed by: Biagio Borg, CRNA Pre-anesthesia Checklist: Patient identified, Emergency Drugs available, Suction available, Patient being monitored and Timeout performed Patient Re-evaluated:Patient Re-evaluated prior to induction Oxygen Delivery Method: Nasal cannula Induction Type: IV induction Placement Confirmation: positive ETCO2 and CO2 detector

## 2021-11-12 NOTE — Anesthesia Postprocedure Evaluation (Signed)
Anesthesia Post Note  Patient: Deborah Todd  Procedure(s) Performed: COLONOSCOPY WITH PROPOFOL ESOPHAGOGASTRODUODENOSCOPY (EGD)  Patient location during evaluation: Endoscopy Anesthesia Type: General Level of consciousness: awake and alert Pain management: pain level controlled Vital Signs Assessment: post-procedure vital signs reviewed and stable Respiratory status: spontaneous breathing, nonlabored ventilation, respiratory function stable and patient connected to nasal cannula oxygen Cardiovascular status: blood pressure returned to baseline and stable Postop Assessment: no apparent nausea or vomiting Anesthetic complications: no   No notable events documented.   Last Vitals:  Vitals:   11/12/21 1109 11/12/21 1139  BP: (!) 89/71 118/77  Pulse:  80  Resp:  20  Temp:    SpO2: 97% 96%    Last Pain:  Vitals:   11/12/21 1139  TempSrc:   PainSc: 0-No pain                 Corinda Gubler

## 2021-11-12 NOTE — Transfer of Care (Signed)
Immediate Anesthesia Transfer of Care Note  Patient: Deborah Todd  Procedure(s) Performed: COLONOSCOPY WITH PROPOFOL ESOPHAGOGASTRODUODENOSCOPY (EGD)  Patient Location: PACU  Anesthesia Type:General  Level of Consciousness: drowsy  Airway & Oxygen Therapy: Patient Spontanous Breathing  Post-op Assessment: Report given to RN and Post -op Vital signs reviewed and stable  Post vital signs: Reviewed and stable  Last Vitals:  Vitals Value Taken Time  BP    Temp 35.7 C 11/12/21 1049  Pulse 106 11/12/21 1051  Resp 18 11/12/21 1051  SpO2 96 % 11/12/21 1051  Vitals shown include unvalidated device data.  Last Pain:  Vitals:   11/12/21 1049  TempSrc: Temporal  PainSc:          Complications: No notable events documented.

## 2021-11-12 NOTE — H&P (Signed)
Jonathon Bellows, MD [address] [place], [place], [place], [address] [address], [place], [place], [place], [address] Phone: [phone]  Fax: [phone]  Primary Care Physician:  [facility]   Pre-Procedure History & Physical: HPI:  Deborah Todd is a 70 y.o. female is here for an endoscopy and colonoscopy    Past Medical History:  Diagnosis Date   Arthritis    Essential hypertension    Mixed hyperlipidemia    Sciatica    Type 2 diabetes mellitus ([place])     Past Surgical History:  Procedure Laterality Date   ABDOMINAL HYSTERECTOMY     Oophorectomy     TUBAL LIGATION      Prior to Admission medications   Medication Sig Start Date End Date Taking? Authorizing Provider  famotidine (PEPCID) 20 MG tablet Take 20 mg by mouth 2 (two) times daily as needed. 08/14/21  Yes [provider]  hydrochlorothiazide (HYDRODIURIL) 12.5 MG tablet Take 12.5 mg by mouth daily. 07/06/20  Yes [provider]  metFORMIN (GLUCOPHAGE-XR) 500 MG 24 hr tablet Take 500 mg by mouth daily. 07/03/20  Yes [provider]  omeprazole (PRILOSEC) 20 MG capsule Take 1 capsule (20 mg total) by mouth 2 (two) times daily before a meal. 10/30/21  Yes Jonathon Bellows, MD  Ascorbic Acid (VITAMIN C) 500 MG CHEW Chew 1 tablet by mouth.    [provider]  atorvastatin (LIPITOR) 20 MG tablet Take 1 tablet by mouth daily.    [provider]  CALCIUM PO Take by mouth.    [provider]  Cyanocobalamin (VITAMIN B12 PO) Take by mouth.    [provider]  fluticasone (FLONASE) 50 MCG/ACT nasal spray Place 1 spray into both nostrils. Patient not taking: Reported on 11/12/2021 09/14/20   [provider]  Glucose Blood (RELION MICRO TEST VI) to check fasting blood sugar    [provider]  naproxen (NAPROSYN) 500 MG tablet Take 1 tablet by mouth in the morning and at bedtime. Patient not taking: Reported on  11/12/2021    [provider]  Potassium Chloride (KLOR-CON 10 PO) Take 10 mEq by mouth daily. Patient not taking: Reported on 11/12/2021    [provider]  Volcano     [provider]  Vitamin D, Cholecalciferol, 10 MCG (400 UNIT) CAPS See admin instructions.    [provider]    Allergies as of 10/30/2021   (No Known Allergies)    Family History  Problem Relation Age of Onset   Cancer Mother    Diabetes Mellitus II Mother     Social History   Socioeconomic History   Marital status: Divorced    Spouse name: Not on file   Number of children: Not on file   Years of education: Not on file   Highest education level: Not on file  Occupational History   Not on file  Tobacco Use   Smoking status: Never   Smokeless tobacco: Never  Vaping Use   Vaping Use: Never used  Substance and Sexual Activity   Alcohol use: Never   Drug use: Never   Sexual activity: Not on file  Other Topics Concern   Not on file  Social History Narrative   Not on file   Social Determinants of Health   Financial Resource Strain: Not on file  Food Insecurity: Not on file  Transportation Needs: Not on file  Physical  Activity: Not on file  Stress: Not on file  Social Connections: Not on file  Intimate Partner Violence: Not on file    Review of Systems: See HPI, otherwise negative ROS  Physical Exam: BP 96/64    Pulse (!) 112    Temp (!) 96.5 F (35.8 C) (Temporal)    Resp 18    Ht 5\' 3"  (1.6 m)    Wt 81.6 kg    SpO2 99%    BMI 31.89 kg/m  General:   Alert,  pleasant and cooperative in NAD Head:  Normocephalic and atraumatic. Neck:  Supple; no masses or thyromegaly. Lungs:  Clear throughout to auscultation, normal respiratory effort.    Heart:  +S1, +S2, Regular rate and rhythm, No edema. Abdomen:  Soft, nontender and nondistended. Normal bowel sounds, without guarding, and without rebound.   Neurologic:  Alert and  oriented x4;   grossly normal neurologically.  Impression/Plan: Deborah Todd is here for an endoscopy and colonoscopy  to be performed for  evaluation of weight loss    Risks, benefits, limitations, and alternatives regarding endoscopy have been reviewed with the patient.  Questions have been answered.  All parties agreeable.   Jonathon Bellows, MD  11/12/2021, 10:02 AM

## 2021-11-12 NOTE — Op Note (Signed)
[facility] Gastroenterology Patient Name: Deborah Todd Procedure Date: 11/12/2021 10:08 AM MRN: [id number] Account #: [id number] Date of Birth: [birth year] Admit Type: Outpatient Age: 71 Room: [facility] ENDO ROOM 4 Gender: Female Note Status: Finalized Instrument Name: Prentice Docker [id number] Procedure:             Colonoscopy Indications:           Weight loss Providers:             Wyline Mood MD, MD Referring MD:          [facility] (Referring MD) Medicines:             Monitored Anesthesia Care Complications:         No immediate complications. Procedure:             Pre-Anesthesia Assessment:                        - Prior to the procedure, a History and Physical was                         performed, and patient medications, allergies and                         sensitivities were reviewed. The patient's tolerance                         of previous anesthesia was reviewed.                        - The risks and benefits of the procedure and the                         sedation options and risks were discussed with the                         patient. All questions were answered and informed                         consent was obtained.                        - ASA Grade Assessment: III - A patient with severe                         systemic disease.                        After obtaining informed consent, the colonoscope was                         passed under direct vision. Throughout the procedure,                         the patient's blood pressure, pulse, and oxygen                         saturations were monitored continuously. The                         Colonoscope was introduced through the  anus and                         advanced to the the cecum, identified by the                         appendiceal orifice. The colonoscopy was performed                         with ease. The patient tolerated the procedure well.                          The quality of the bowel preparation was adequate. Findings:      The perianal and digital rectal examinations were normal.      A 5 mm polyp was found in the descending colon. The polyp was sessile.       The polyp was removed with a cold snare. Resection and retrieval were       complete.      The exam was otherwise without abnormality on direct and retroflexion       views. Impression:            - One 5 mm polyp in the descending colon, removed with                         a cold snare. Resected and retrieved.                        - The examination was otherwise normal on direct and                         retroflexion views. Recommendation:        - Discharge patient to home (with escort).                        - Resume previous diet.                        - Continue present medications.                        - Await pathology results.                        - Repeat colonoscopy is not recommended due to current                         age (39 years or older) for surveillance.                        - Perform a CT scan (computed tomography) of chest                         with contrast, abdomen with contrast and pelvis with                         contrast in 1 week.                        - Return to my office as  previously scheduled. Procedure Code(s):     --- Professional ---                        [phone], Colonoscopy, flexible; with removal of                         tumor(s), polyp(s), or other lesion(s) by snare                         technique Diagnosis Code(s):     --- Professional ---                        K63.5, Polyp of colon                        R63.4, Abnormal weight loss CPT copyright 2019 American Medical Association. All rights reserved. The codes documented in this report are preliminary and upon coder review may  be revised to meet current compliance requirements. Wyline Mood, MD Wyline Mood MD, MD 11/12/2021 10:48:32 AM This report has been  signed electronically. Number of Addenda: 0 Note Initiated On: 11/12/2021 10:08 AM Scope Withdrawal Time: 0 hours 8 minutes 43 seconds  Total Procedure Duration: 0 hours 11 minutes 5 seconds  Estimated Blood Loss:  Estimated blood loss: none.      [facility]

## 2021-11-13 ENCOUNTER — Encounter: Payer: Self-pay | Admitting: Gastroenterology

## 2021-11-14 LAB — SURGICAL PATHOLOGY

## 2021-11-18 NOTE — Progress Notes (Signed)
Polyps were benign

## 2021-11-19 ENCOUNTER — Telehealth: Payer: Self-pay

## 2021-11-19 ENCOUNTER — Other Ambulatory Visit: Payer: Self-pay

## 2021-11-19 ENCOUNTER — Encounter: Payer: Self-pay | Admitting: Gastroenterology

## 2021-11-19 ENCOUNTER — Ambulatory Visit (INDEPENDENT_AMBULATORY_CARE_PROVIDER_SITE_OTHER): Payer: Medicare HMO | Admitting: Gastroenterology

## 2021-11-19 VITALS — BP 89/59 | HR 120 | Temp 97.9°F | Wt 176.6 lb

## 2021-11-19 DIAGNOSIS — R634 Abnormal weight loss: Secondary | ICD-10-CM | POA: Diagnosis not present

## 2021-11-19 NOTE — Telephone Encounter (Signed)
Patient insurance is requiring her to go to [facility] for CT scan and patient is schedule at out patient imaging

## 2021-11-19 NOTE — Progress Notes (Signed)
Deborah Mood MD, MRCP(U.K) [address]  [address]  [place], [place] [address]  Main: [phone]  Fax: [phone]   Primary Care Physician: [facility]  Primary Gastroenterologist:  Dr. Wyline Todd   Chief Complaint  Patient presents with   Weight Loss    HPI: Deborah Todd is a 71 y.o. female   Summary of history :  Initially referred and seen on 10/30/2021 for poor appetite ,weight loss of over 20 pounds with 2 months.  States that she has had a few episodes of vomiting.  Irregular bowel movements.  She may go for few days without having a bowel movement.  Does not eat much.  Feels full with a few bites.  No inclination to eat.  Last colonoscopy was many years back.  Does not recollect having any polyps. Recent ER visit on 10/01/2021: Presented with abdominal pain , nausea,vomiting and dysphagia. CT abdomen: Nil acute.CBC, CMP -normal . UA shows multiple RBC's , bacteria , small hb .   Interval history 10/30/2021-11/19/2021   10/30/2021:TSH , CBC,CMP normal 10/12/2021: EGD: Large hiatal hernia and polyp resected in stomach (benign). Colonoscopy: polyp resected (benign).  Lost 3 pounds since last visit.  She states that he was she was on metformin but not on any medications for diabetes at this point of time.   Current Outpatient Medications  Medication Sig Dispense Refill   Ascorbic Acid (VITAMIN C) 500 MG CHEW Chew 1 tablet by mouth.     CALCIUM PO Take by mouth.     Cyanocobalamin (VITAMIN B12 PO) Take by mouth.     famotidine (PEPCID) 20 MG tablet Take 20 mg by mouth 2 (two) times daily as needed.     omeprazole (PRILOSEC) 20 MG capsule Take 1 capsule (20 mg total) by mouth 2 (two) times daily before a meal. 180 capsule 3   Vitamin D, Cholecalciferol, 10 MCG (400 UNIT) CAPS See admin instructions.     fluticasone (FLONASE) 50 MCG/ACT nasal spray Place 1 spray into both nostrils. (Patient not taking: Reported on 11/19/2021)     No  current facility-administered medications for this visit.    Allergies as of 11/19/2021   (No Known Allergies)    ROS:  General: Negative for anorexia, weight loss, fever, chills, fatigue, weakness. ENT: Negative for hoarseness, difficulty swallowing , nasal congestion. CV: Negative for chest pain, angina, palpitations, dyspnea on exertion, peripheral edema.  Respiratory: Negative for dyspnea at rest, dyspnea on exertion, cough, sputum, wheezing.  GI: See history of present illness. GU:  Negative for dysuria, hematuria, urinary incontinence, urinary frequency, nocturnal urination.  Endo: Negative for unusual weight change.    Physical Examination:   BP (!) 89/59    Pulse (!) 120    Temp 97.9 F (36.6 C) (Oral)    Wt 176 lb 9.6 oz (80.1 kg)    BMI 31.28 kg/m   General: Well-nourished, well-developed in no acute distress.  Eyes: No icterus. Conjunctivae pink. Mouth: Oropharyngeal mucosa moist and pink , no lesions erythema or exudate. Neuro: Alert and oriented x 3.  Grossly intact. Skin: Warm and dry, no jaundice.   Psych: Alert and cooperative, normal Todd and affect.   Imaging Studies: No results found.  Assessment and Plan:   Deborah Todd is a 71 y.o. y/o female here to follow up for unintentional weight loss of over 20 pounds over the past few months, dyspeptic symptoms and change in bowel habits.   Plan  1.  CT chest to evaluate unintentional weight loss 2.  Check HbA1c to screen for diabetes    Dr Jonathon Bellows  MD,MRCP [facility]) Follow up in 4 weeks video visit

## 2021-11-20 ENCOUNTER — Ambulatory Visit (INDEPENDENT_AMBULATORY_CARE_PROVIDER_SITE_OTHER): Payer: Medicare HMO | Admitting: Gastroenterology

## 2021-11-20 LAB — HEMOGLOBIN A1C
Est. average glucose Bld gHb Est-mCnc: 126 mg/dL
Hgb A1c MFr Bld: 6 % — ABNORMAL HIGH (ref 4.8–5.6)

## 2021-11-20 NOTE — Telephone Encounter (Signed)
Called central scheduling and changed patients facility, date and time per Ashley's request according to insurance coverage. Patient was also notified.

## 2021-11-20 NOTE — Progress Notes (Signed)
Inform she is pre diabetic but not diabetic

## 2021-11-28 ENCOUNTER — Ambulatory Visit: Payer: Medicare HMO

## 2021-12-03 ENCOUNTER — Ambulatory Visit: Payer: Medicare HMO

## 2021-12-07 ENCOUNTER — Ambulatory Visit: Payer: Medicare HMO

## 2021-12-25 ENCOUNTER — Ambulatory Visit: Payer: Medicare HMO | Admitting: Gastroenterology

## 2022-01-15 ENCOUNTER — Encounter: Payer: Self-pay | Admitting: Internal Medicine

## 2022-01-28 ENCOUNTER — Ambulatory Visit: Payer: Medicare HMO | Admitting: Gastroenterology

## 2022-02-27 ENCOUNTER — Encounter: Payer: Self-pay | Admitting: Internal Medicine

## 2022-02-27 ENCOUNTER — Ambulatory Visit: Payer: Medicare HMO | Admitting: Gastroenterology

## 2023-06-28 IMAGING — CT CT ABD-PELV W/ CM
2 of 5 series · 17 of 46 positions shown, 19 images · IV contrast (omnipaque)
Comparison: None.

CLINICAL DATA: Nausea and vomiting. Abdominal pain and weight loss.

EXAM:
CT ABDOMEN AND PELVIS WITH CONTRAST
TECHNIQUE: Multidetector CT imaging of the abdomen and pelvis was performed
using the standard protocol following bolus administration of
intravenous contrast.
CONTRAST:  80mL OMNIPAQUE IOHEXOL 350 MG/ML SOLN

[Series 2: axial st · axial · 0.80mm/px · z∈[-180,+210]mm · 14 of 90 slices shown, 16 images]
[im 6/90  soft-tissue]
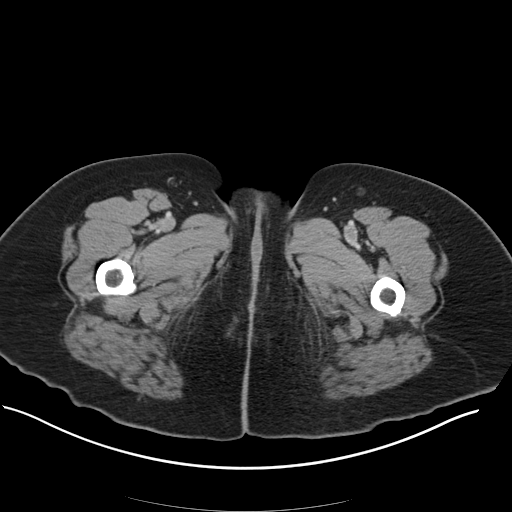
[im 6/90  bone]
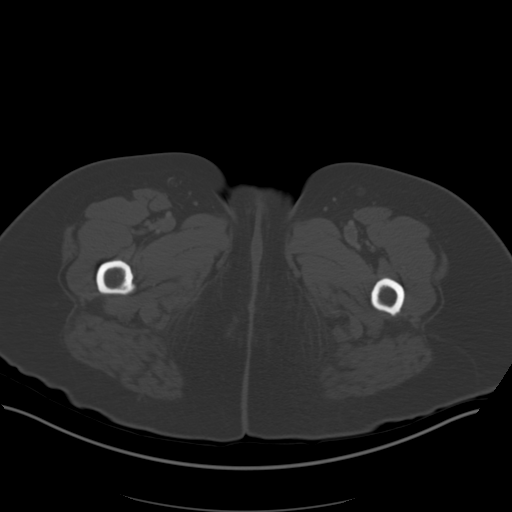
[im 12/90  soft-tissue]
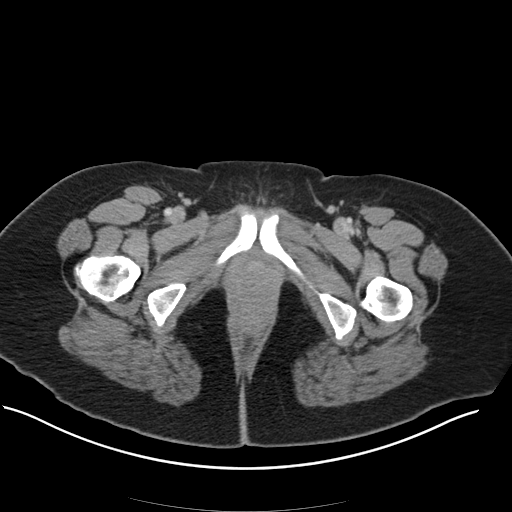
[im 18/90  soft-tissue]
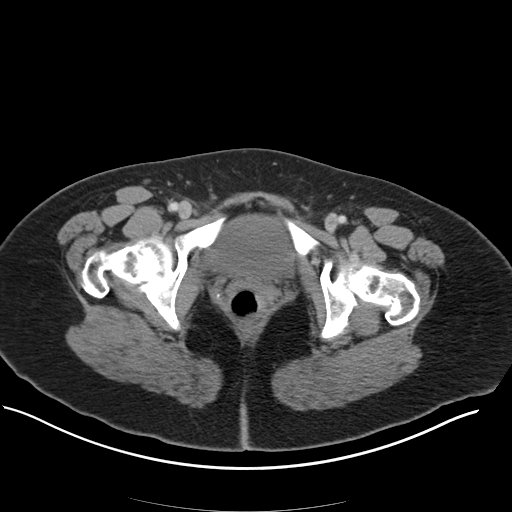
[im 24/90  soft-tissue]
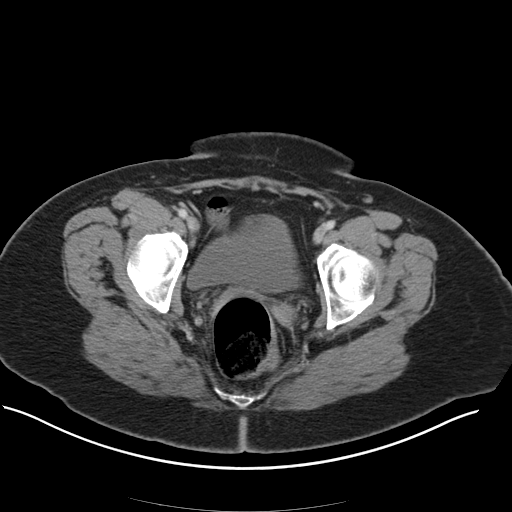
[im 30/90  soft-tissue]
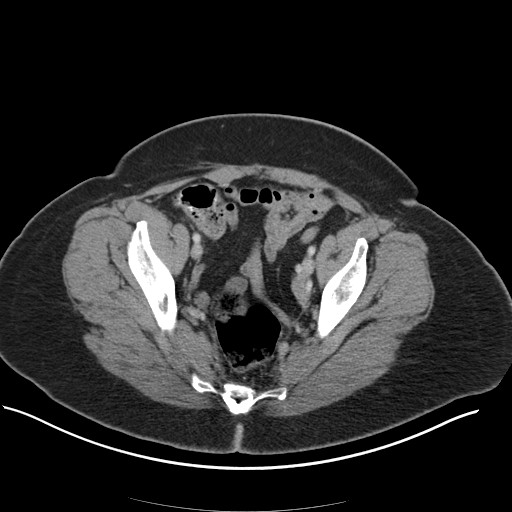
[im 36/90  soft-tissue]
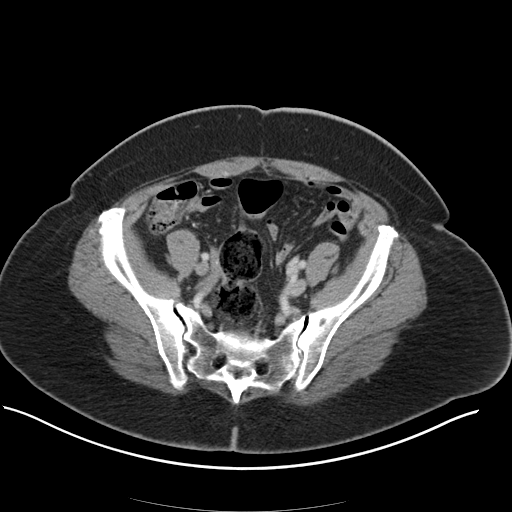
[im 42/90  soft-tissue]
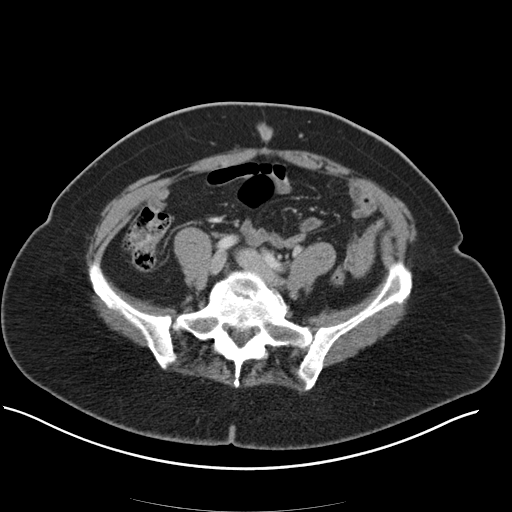
[im 48/90  soft-tissue]
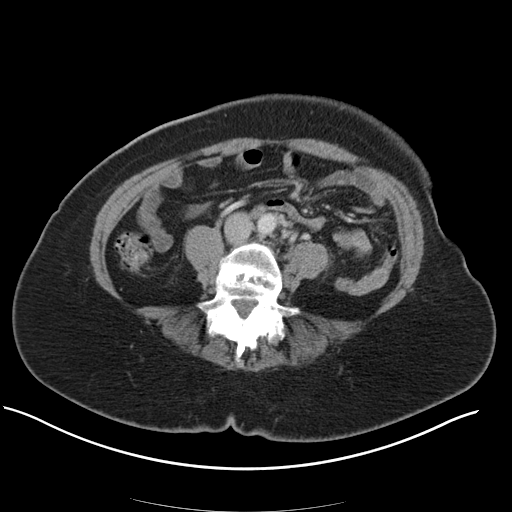
[im 54/90  soft-tissue]
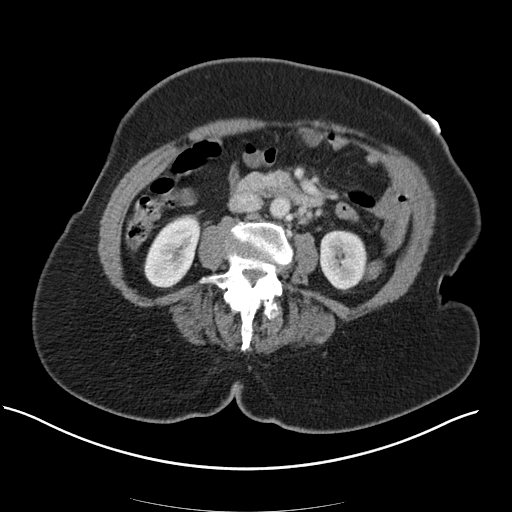
[im 54/90  bone]
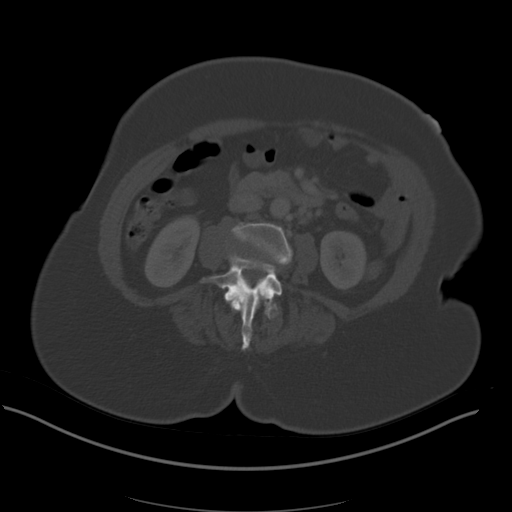
[im 60/90  soft-tissue]
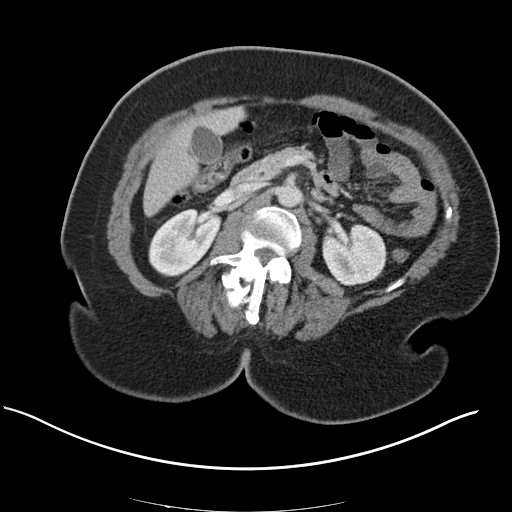
[im 66/90  soft-tissue]
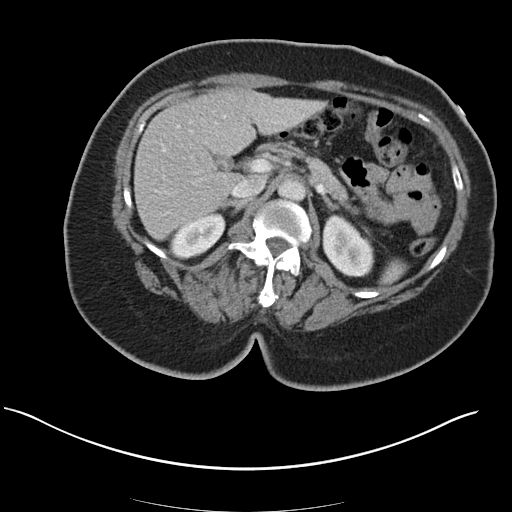
[im 72/90  soft-tissue]
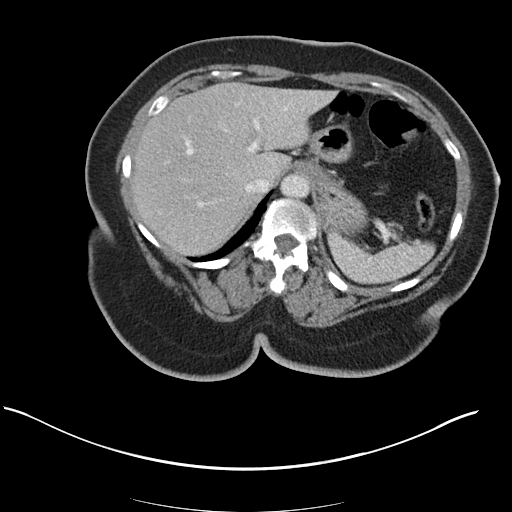
[im 78/90  soft-tissue]
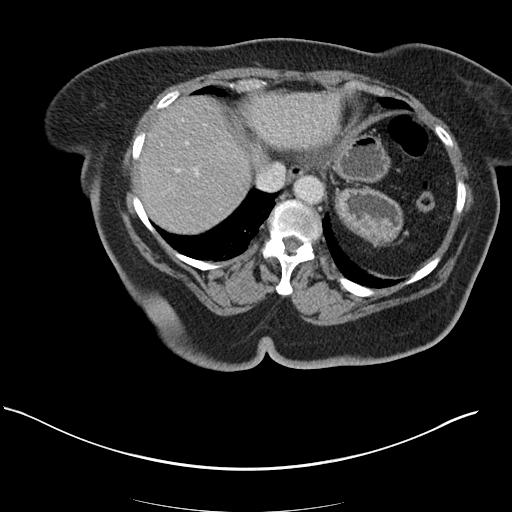
[im 84/90  soft-tissue]
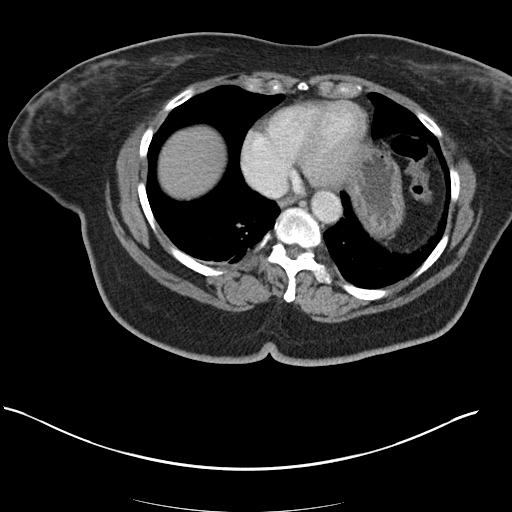

[Series 4: coronal st · coronal · 0.77mm/px · 3 of 175 slices shown]
[im 59/175  soft-tissue]
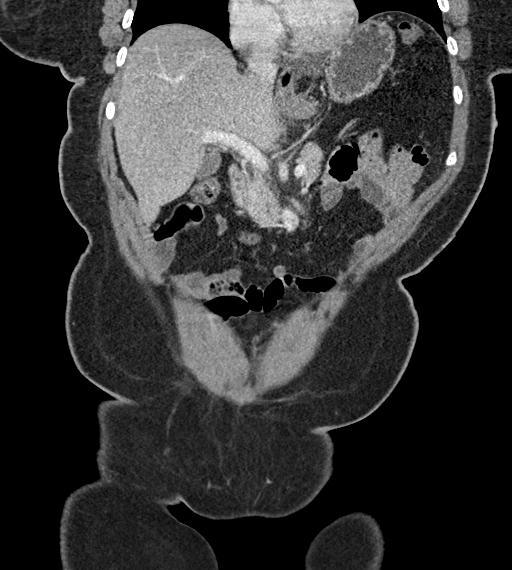
[im 78/175  soft-tissue]
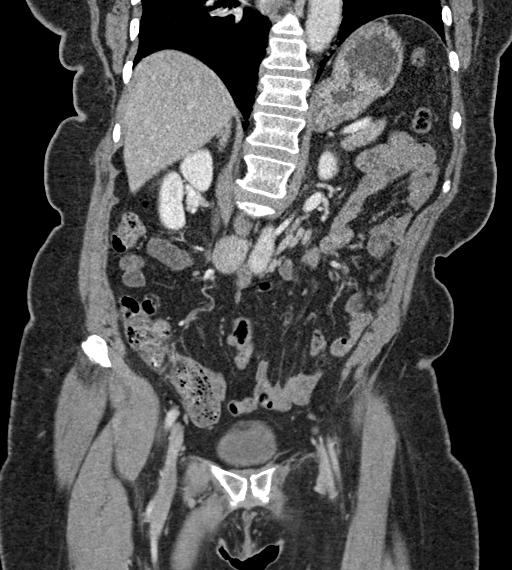
[im 97/175  soft-tissue]
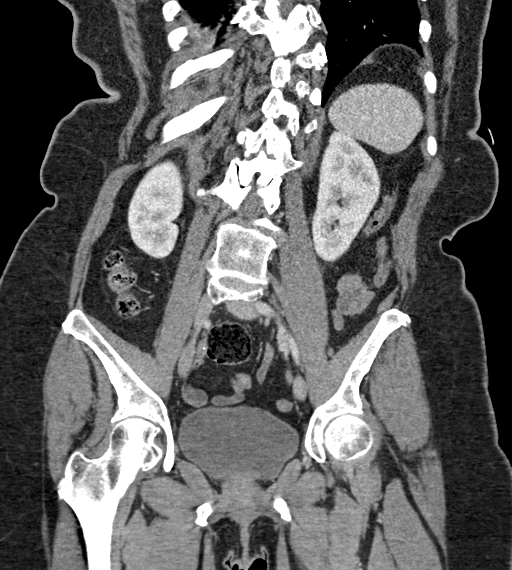

[17 of 46 positions shown; findings below may reference images not displayed]

FINDINGS: Lower chest: There is atelectasis in the right lung base.

Hepatobiliary: No focal liver abnormality is seen. No gallstones,
gallbladder wall thickening, or biliary dilatation.

Pancreas: Unremarkable. No pancreatic ductal dilatation or
surrounding inflammatory changes.

Spleen: Normal in size without focal abnormality.

Adrenals/Urinary Tract: Adrenal glands are unremarkable. Kidneys are
normal, without renal calculi, focal lesion, or hydronephrosis.
Bladder is unremarkable.

Stomach/Bowel: Stomach is within normal limits. Appendix appears
normal. No evidence of bowel wall thickening, distention, or
inflammatory changes.

Vascular/Lymphatic: No significant vascular findings are present. No
enlarged abdominal or pelvic lymph nodes.

Reproductive: Status post hysterectomy. No adnexal masses.

Other: There is no ascites. There is mild elevation of the left
hemidiaphragm.

Musculoskeletal: Degenerative changes affect the spine and
sacroiliac joints. No acute fractures.
IMPRESSION: 1. No acute localizing process in the abdomen or pelvis.

## 2023-08-21 ENCOUNTER — Other Ambulatory Visit (HOSPITAL_COMMUNITY): Payer: Self-pay | Admitting: Family Medicine

## 2023-08-21 DIAGNOSIS — Z1239 Encounter for other screening for malignant neoplasm of breast: Secondary | ICD-10-CM

## 2023-09-08 ENCOUNTER — Ambulatory Visit (HOSPITAL_COMMUNITY)
Admission: RE | Admit: 2023-09-08 | Discharge: 2023-09-08 | Disposition: A | Discharge status: Home / Self Care | Payer: Medicare HMO | Source: Ambulatory Visit | Attending: Family Medicine | Admitting: Family Medicine

## 2023-09-08 DIAGNOSIS — Z1239 Encounter for other screening for malignant neoplasm of breast: Secondary | ICD-10-CM | POA: Diagnosis present

## 2023-09-08 DIAGNOSIS — Z1231 Encounter for screening mammogram for malignant neoplasm of breast: Secondary | ICD-10-CM | POA: Diagnosis not present

## 2024-03-26 ENCOUNTER — Ambulatory Visit (HOSPITAL_COMMUNITY)

## 2024-05-05 ENCOUNTER — Ambulatory Visit (HOSPITAL_COMMUNITY)

## 2024-05-20 ENCOUNTER — Ambulatory Visit: Admitting: Diagnostic Neuroimaging

## 2024-06-25 NOTE — Therapy (Deleted)
 OUTPATIENT PHYSICAL THERAPY NEURO EVALUATION   Patient Name: Deborah Todd MRN: [id number] DOB:23-Feb-1951, 73 y.o., female Today's Date: 06/25/2024   PCP: [facility]  REFERRING PROVIDER: Joshua Rayfield RAMAN, NP  END OF SESSION:   Past Medical History:  Diagnosis Date   Arthritis    Essential hypertension    Mixed hyperlipidemia    Sciatica    Type 2 diabetes mellitus ([facility])    Past Surgical History:  Procedure Laterality Date   ABDOMINAL HYSTERECTOMY     COLONOSCOPY WITH PROPOFOL  N/A 11/12/2021   Procedure: COLONOSCOPY WITH PROPOFOL ;  Surgeon: Therisa Bi, MD;  Location: [facility] ENDOSCOPY;  Service: Gastroenterology;  Laterality: N/A;   ESOPHAGOGASTRODUODENOSCOPY N/A 11/12/2021   Procedure: ESOPHAGOGASTRODUODENOSCOPY (EGD);  Surgeon: Therisa Bi, MD;  Location: [facility] ENDOSCOPY;  Service: Gastroenterology;  Laterality: N/A;   Oophorectomy     TUBAL LIGATION     Patient Active Problem List   Diagnosis Date Noted   Arthralgia of lower leg 10/30/2021   Encounter for general adult medical examination without abnormal findings 10/30/2021   Gastroesophageal reflux disease 10/30/2021   History of hysterectomy 10/30/2021   Impaired fasting glucose 10/30/2021   Impairment of balance 10/30/2021   Leukocytosis 10/30/2021   Sinusitis 10/30/2021   Type 2 diabetes mellitus without complications (HCC) 10/30/2021   Diabetes mellitus (HCC) 02/21/2021   Hypertension 02/21/2021   Hyperlipidemia 02/21/2021   Morbid obesity (HCC) 02/21/2021   Sciatica 02/21/2021    ONSET DATE: ***  REFERRING DIAG: R53.83 (ICD-10-CM) - Other fatigue D32.9 (ICD-10-CM) - Benign neoplasm of meninges, unspecified R53.1 (ICD-10-CM) - Weakness M62.59 (ICD-10-CM) - Muscle wasting and atrophy, not elsewhere classified, multiple sites  THERAPY DIAG:  No diagnosis found.  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 Imaging and neurologic exam are stable subtle decrease in measurable size  Patient presents to the clinic and is getting stronger and has gained some weight. She is still seeing Oncology and they just did an MRI which stated things are improving and her next MRI is in the Spring.  Has done PT to help with strength and endurance.    PAIN:  Are you having pain? {OPRCPAIN:27236}  PRECAUTIONS: {Therapy precautions:24002}  RED FLAGS: {PT  Red Flags:29287}   WEIGHT BEARING RESTRICTIONS: No  FALLS: Has patient fallen in last 6 months? {fallsyesno:27318}  LIVING ENVIRONMENT: Lives with: {OPRC lives with:25569::lives with their family} Lives in: {Lives in:25570} Stairs: {opstairs:27293} Has following equipment at home: {Assistive devices:23999}  PLOF: {PLOF:24004}  PATIENT GOALS: ***  OBJECTIVE:  Note: Objective measures were completed at Evaluation unless otherwise noted.  DIAGNOSTIC FINDINGS: ***  COGNITION: Overall cognitive status: {cognition:24006}   SENSATION: {sensation:27233}  COORDINATION: ***  EDEMA:  {edema:24020}  MUSCLE TONE: {LE tone:25568}  MUSCLE LENGTH: Hamstrings: Right *** deg; Left *** deg Debby test: Right *** deg; Left *** deg  DTRs:  {DTR SITE:24025}  POSTURE: {posture:25561}  LOWER EXTREMITY ROM:     {AROM/PROM:27142}  Right Eval Left Eval  Hip flexion    Hip extension    Hip abduction    Hip adduction    Hip internal rotation    Hip external rotation    Knee flexion    Knee extension    Ankle dorsiflexion    Ankle plantarflexion    Ankle inversion    Ankle eversion     (Blank  rows = not tested)  LOWER EXTREMITY MMT:    MMT Right Eval Left Eval  Hip flexion    Hip extension    Hip abduction    Hip adduction    Hip internal rotation    Hip external rotation    Knee flexion    Knee extension    Ankle dorsiflexion    Ankle plantarflexion    Ankle inversion    Ankle eversion    (Blank rows = not tested)  BED MOBILITY:  {bed mobility:32615:p}  TRANSFERS: {transfers eval:32620}  RAMP:  {ramp eval:32616}  CURB:  {curb eval:32617}  STAIRS: {stairs eval:32618} GAIT: Findings: {GaitneuroPT:32644::Distance walked: ***,Comments: ***}  FUNCTIONAL TESTS:  {Functional tests:24029}  PATIENT SURVEYS:  {rehab surveys:24030}                                                                                                                              TREATMENT DATE:  06/28/24:PT Eval and HEP    PATIENT EDUCATION: Education details: PT evaluation, objective findings, POC, Importance of HEP, Precautions, Clinic policies  Person educated: Patient Education method: Explanation and Demonstration Education comprehension: verbalized understanding and returned demonstration  HOME EXERCISE PROGRAM: ***  GOALS: Goals reviewed with patient? No  SHORT TERM GOALS: Target date: ***  *** Baseline: Goal status: INITIAL  2.  *** Baseline:  Goal status: INITIAL  3.  *** Baseline:  Goal status: INITIAL  4.  *** Baseline:  Goal status: INITIAL  5.  *** Baseline:  Goal status: INITIAL  6.  *** Baseline:  Goal status: INITIAL  LONG TERM GOALS: Target date: ***  *** Baseline:  Goal status: INITIAL  2.  *** Baseline:  Goal status: INITIAL  3.  *** Baseline:  Goal status: INITIAL  4.  *** Baseline:  Goal status: INITIAL  5.  ***  Baseline:  Goal status: INITIAL  6.  *** Baseline:  Goal status: INITIAL  ASSESSMENT:  CLINICAL IMPRESSION: Patient is a 73 y.o. female who was seen today for physical therapy evaluation and  treatment for R53.83 (ICD-10-CM) - Other fatigue D32.9 (ICD-10-CM) - Benign neoplasm of meninges, unspecified R53.1 (ICD-10-CM) - Weakness M62.59 (ICD-10-CM) - Muscle wasting and atrophy, not elsewhere classified, multiple sites.    OBJECTIVE IMPAIRMENTS: {opptimpairments:25111}.   ACTIVITY LIMITATIONS: {activitylimitations:27494}  PARTICIPATION LIMITATIONS: {participationrestrictions:25113}  PERSONAL FACTORS: {Personal factors:25162} are also affecting patient's functional outcome.   REHAB POTENTIAL: {rehabpotential:25112}  CLINICAL DECISION MAKING: {clinical decision making:25114}  EVALUATION COMPLEXITY: {Evaluation complexity:25115}  PLAN:  PT FREQUENCY: 2x/week  PT DURATION: 8 weeks  PLANNED INTERVENTIONS: 97164- PT Re-evaluation, 97110-Therapeutic exercises, 97530- Therapeutic activity, [id number]- Neuromuscular re-education, 97535- Self Care, 02859- Manual therapy, [id number]- Gait training, [phone]- Electrical stimulation (manual), [id number]- Ultrasound, 02987- Traction (mechanical), 20560 (1-2 muscles), 20561 (3+ muscles)- Dry Needling, Patient/Family education, Balance training, Stair training, Taping, Joint mobilization, Spinal mobilization, Cryotherapy, and Moist heat  PLAN FOR NEXT SESSION: Review HEP and goals,    4:47 PM, 06/25/24 Rosaria Settler, PT, DPT [facility] [place]

## 2024-06-28 ENCOUNTER — Ambulatory Visit (HOSPITAL_COMMUNITY)

## 2024-07-01 ENCOUNTER — Other Ambulatory Visit (HOSPITAL_COMMUNITY): Payer: Self-pay | Admitting: Family Medicine

## 2024-07-01 DIAGNOSIS — Z13820 Encounter for screening for osteoporosis: Secondary | ICD-10-CM
